# Patient Record
Sex: Female | Born: 1955 | Race: White | Hispanic: No | Marital: Married | Smoking: Never smoker
Health system: Southern US, Community
[De-identification: ages and names within clinical notes are randomized; demographics above are authoritative.]

## PROBLEM LIST (undated history)

## (undated) DIAGNOSIS — IMO0001 Reserved for inherently not codable concepts without codable children: Secondary | ICD-10-CM

## (undated) DIAGNOSIS — M8580 Other specified disorders of bone density and structure, unspecified site: Secondary | ICD-10-CM

## (undated) DIAGNOSIS — K219 Gastro-esophageal reflux disease without esophagitis: Secondary | ICD-10-CM

## (undated) DIAGNOSIS — C801 Malignant (primary) neoplasm, unspecified: Secondary | ICD-10-CM

## (undated) DIAGNOSIS — E785 Hyperlipidemia, unspecified: Secondary | ICD-10-CM

## (undated) DIAGNOSIS — Z923 Personal history of irradiation: Secondary | ICD-10-CM

## (undated) DIAGNOSIS — E039 Hypothyroidism, unspecified: Secondary | ICD-10-CM

## (undated) HISTORY — DX: Hypothyroidism, unspecified: E03.9

## (undated) HISTORY — DX: Other specified disorders of bone density and structure, unspecified site: M85.80

## (undated) HISTORY — PX: OTHER SURGICAL HISTORY: SHX169

## (undated) HISTORY — DX: Hyperlipidemia, unspecified: E78.5

## (undated) HISTORY — DX: Reserved for inherently not codable concepts without codable children: IMO0001

## (undated) HISTORY — DX: Malignant (primary) neoplasm, unspecified: C80.1

## (undated) HISTORY — DX: Gastro-esophageal reflux disease without esophagitis: K21.9

---

## 1980-04-28 HISTORY — PX: TUBAL LIGATION: SHX77

## 2009-03-28 LAB — HM COLONOSCOPY

## 2010-04-24 LAB — HM PAP SMEAR

## 2010-05-13 ENCOUNTER — Encounter
Admission: RE | Admit: 2010-05-13 | Discharge: 2010-05-13 | Payer: Self-pay | Source: Home / Self Care | Attending: Obstetrics and Gynecology | Admitting: Obstetrics and Gynecology

## 2010-05-13 LAB — HM MAMMOGRAPHY

## 2012-04-28 DIAGNOSIS — C801 Malignant (primary) neoplasm, unspecified: Secondary | ICD-10-CM

## 2012-04-28 DIAGNOSIS — Z923 Personal history of irradiation: Secondary | ICD-10-CM

## 2012-04-28 HISTORY — DX: Malignant (primary) neoplasm, unspecified: C80.1

## 2012-04-28 HISTORY — DX: Personal history of irradiation: Z92.3

## 2012-07-15 ENCOUNTER — Encounter: Payer: Self-pay | Admitting: Obstetrics and Gynecology

## 2012-07-15 ENCOUNTER — Ambulatory Visit (INDEPENDENT_AMBULATORY_CARE_PROVIDER_SITE_OTHER): Payer: BC Managed Care – PPO | Admitting: Obstetrics and Gynecology

## 2012-07-15 VITALS — BP 124/70 | Ht 62.5 in | Wt 167.0 lb

## 2012-07-15 DIAGNOSIS — Z1231 Encounter for screening mammogram for malignant neoplasm of breast: Secondary | ICD-10-CM

## 2012-07-15 DIAGNOSIS — K5900 Constipation, unspecified: Secondary | ICD-10-CM

## 2012-07-15 DIAGNOSIS — R350 Frequency of micturition: Secondary | ICD-10-CM

## 2012-07-15 DIAGNOSIS — Z0000 Encounter for general adult medical examination without abnormal findings: Secondary | ICD-10-CM

## 2012-07-15 NOTE — Patient Instructions (Signed)
Urinary Frequency The number of times a normal person urinates depends upon how much liquid they take in and how much liquid they are losing. If the temperature is hot and there is high humidity then the person will sweat more and usually breathe a little more frequently. These factors decrease the amount of frequency of urination that would be considered normal. The amount you drink is easily determined, but the amount of fluid lost is sometimes more difficult to calculate.  Fluid is lost in two ways:  Sensible fluid loss is usually measured by the amount of urine that you get rid of. Losses of fluid can also occur with diarrhea.  Insensible fluid loss is more difficult to measure. It is caused by evaporation. Insensible loss of fluid occurs through breathing and sweating. It usually ranges from a little less than a quart to a little more than a quart of fluid a day. In normal temperatures and activity levels the average person may urinate 4 to 7 times in a 24-hour period. Needing to urinate more often than that could indicate a problem. If one urinates 4 to 7 times in 24 hours and has large volumes each time, that could indicate a different problem from one who urinates 4 to 7 times a day and has small volumes. The time of urinating is also an important. Most urinating should be done during the waking hours. Getting up at night to urinate frequently can indicate some problems. CAUSES  The bladder is the organ in your lower abdomen that holds urine. Like a balloon, it swells some as it fills up. Your nerves sense this and tell you it is time to head for the bathroom. There are a number of reasons that you might feel the need to urinate more often than usual. They include:  Urinary tract infection. This is usually associated with other signs such as burning when you urinate.  In men, problems with the prostate (a walnut-size gland that is located near the tube that carries urine out of your body).  There are two reasons why the prostate can cause an increased frequency of urination:  An enlarged prostate that does not let the bladder empty well. If the bladder only half empties when you urinate then it only has half the capacity to fill before you have to urinate again.  The nerves in the bladder become more hypersensitive with an increased size of the prostate even if the bladder empties completely.  Pregnancy.  Obesity. Excess weight is more likely to cause a problem for women more than for men.  Bladder stones or other bladder problems.  Caffeine.  Alcohol.  Medications. For example, drugs that help the body get rid of extra fluid (diuretics) increase urine production. Some other medicines must be taken with lots of fluids.  Muscle or nerve weakness. This might be the result of a spinal cord injury, a stroke, multiple sclerosis or Parkinson's disease.  Long-standing diabetes can decrease the sensation of the bladder. This loss of sensation makes it harder to sense the bladder needs to be emptied. Over a period of years the bladder is stretched out by constant overfilling. This weakens the bladder muscles so that the bladder does not empty well and has less capacity to fill with new urine.  Interstitial cystitis (also called painful bladder syndrome). This condition develops because the tissues that line the insider of the bladder are inflamed (inflammation is the body's way of reacting to injury or infection). It causes pain  and frequent urination. It occurs in women more often than in men. DIAGNOSIS   To decide what might be causing your urinary frequency, your healthcare provider will probably:  Ask about symptoms you have noticed.  Ask about your overall health. This will include questions about any medications you are taking.  Do a physical examination.  Order some tests. These might include:  A blood test to check for diabetes or other health issues that could be  contributing to the problem.  Urine testing. This could measure the flow of urine and the pressure on the bladder.  A test of your neurological system (the brain, spinal cord and nerves). This is the system that senses the need to urinate.  A bladder test to check whether it is emptying completely when you urinate.  Cytoscopy. This test uses a thin tube with a tiny camera on it. It offers a look inside your urethra and bladder to see if there are problems.  Imaging tests. You might be given a contrast dye and then asked to urinate. X-rays are taken to see how your bladder is working. TREATMENT  It is important for you to be evaluated to determine if the amount or frequency that you have is unusual or abnormal. If it is found to be abnormal the cause should be determined and this can usually be found out easily. Depending upon the cause treatment could include medication, stimulation of the nerves, or surgery. There are not too many things that you can do as an individual to change your urinary frequency. It is important that you balance the amount of fluid intake needed to compensate for your activity and the temperature. Medical problems will be diagnosed and taken care of by your physician. There is no particular bladder training such as Kegel's exercises that you can do to help urinary frequency. This is an exercise this is usually done for people who have leaking of urine when they laugh cough or sneeze. HOME CARE INSTRUCTIONS   Take any medications your healthcare provider prescribed or suggested. Follow the directions carefully.  Practice any lifestyle changes that are recommended. These might include:  Drinking less fluid or drinking at different times of the day. If you need to urinate often during the night, for example, you may need to stop drinking fluids early in the evening.  Cutting down on caffeine or alcohol. They both can make you need to urinate more often than normal.  Caffeine is found in coffee, tea and sodas.  Losing weight, if that is recommended.  Keep a journal or a log. You might be asked to record how much you drink and when and when you feel the need to urinate. This will also help evaluate how well the treatment provided by your physician is working. SEEK MEDICAL CARE IF:   Your need to urinate often gets worse.  You feel increased pain or irritation when you urinate.  You notice blood in your urine.  You have questions about any medications that your healthcare provider recommended.  You notice blood, pus or swelling at the site of any test or treatment procedure.  You develop a fever of more than 100.5 F (38.1 C). SEEK IMMEDIATE MEDICAL CARE IF:  You develop a fever of more than 102.0 F (38.9 C). Document Released: 02/08/2009 Document Revised: 07/07/2011 Document Reviewed: 02/08/2009 [facility] Patient Information 2013 [place], [place].  Constipation, Adult Constipation is when a person has fewer than 3 bowel movements a week; has difficulty having a  bowel movement; or has stools that are dry, hard, or larger than normal. As people grow older, constipation is more common. If you try to fix constipation with medicines that make you have a bowel movement (laxatives), the problem may get worse. Long-term laxative use may cause the muscles of the colon to become weak. A low-fiber diet, not taking in enough fluids, and taking certain medicines may make constipation worse. CAUSES   Certain medicines, such as antidepressants, pain medicine, iron supplements, antacids, and water pills.   Certain diseases, such as diabetes, irritable bowel syndrome (IBS), thyroid disease, or depression.   Not drinking enough water.   Not eating enough fiber-rich foods.   Stress or travel.  Lack of physical activity or exercise.  Not going to the restroom when there is the urge to have a bowel movement.  Ignoring the urge to have a bowel  movement.  Using laxatives too much. SYMPTOMS   Having fewer than 3 bowel movements a week.   Straining to have a bowel movement.   Having hard, dry, or larger than normal stools.   Feeling full or bloated.   Pain in the lower abdomen.  Not feeling relief after having a bowel movement. DIAGNOSIS  Your caregiver will take a medical history and perform a physical exam. Further testing may be done for severe constipation. Some tests may include:   A barium enema X-ray to examine your rectum, colon, and sometimes, your small intestine.  A sigmoidoscopy to examine your lower colon.  A colonoscopy to examine your entire colon. TREATMENT  Treatment will depend on the severity of your constipation and what is causing it. Some dietary treatments include drinking more fluids and eating more fiber-rich foods. Lifestyle treatments may include regular exercise. If these diet and lifestyle recommendations do not help, your caregiver may recommend taking over-the-counter laxative medicines to help you have bowel movements. Prescription medicines may be prescribed if over-the-counter medicines do not work.  HOME CARE INSTRUCTIONS   Increase dietary fiber in your diet, such as fruits, vegetables, whole grains, and beans. Limit high-fat and processed sugars in your diet, such as [place] fries, hamburgers, cookies, candies, and soda.   A fiber supplement may be added to your diet if you cannot get enough fiber from foods.   Drink enough fluids to keep your urine clear or pale yellow.   Exercise regularly or as directed by your caregiver.   Go to the restroom when you have the urge to go. Do not hold it.  Only take medicines as directed by your caregiver. Do not take other medicines for constipation without talking to your caregiver first. SEEK IMMEDIATE MEDICAL CARE IF:   You have bright red blood in your stool.   Your constipation lasts for more than 4 days or gets worse.   You  have abdominal or rectal pain.   You have thin, pencil-like stools.  You have unexplained weight loss. MAKE SURE YOU:   Understand these instructions.  Will watch your condition.  Will get help right away if you are not doing well or get worse. Document Released: 01/11/2004 Document Revised: 07/07/2011 Document Reviewed: 03/18/2011 [facility] Patient Information 2013 [place], [place].

## 2012-07-15 NOTE — Progress Notes (Signed)
Patient ID: Kelsey Meadows, female   DOB: February 11, 1956, 57 y.o.   MRN: [id number]  [id number] with a LMP 2010 or 2011 who presents with bladder prolapse noted by family medicine physician.  Patient reports strong odor to urine, burning of skin with urination.  PCP treated patient for minor UTI due to these symptoms and back pain.   Feels protrusion.  Has urgency with voiding.  Bought Poise pads for protection.  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  Doing fluid restriction.    States backache, unbearable.  Using a back brace.  Patient diagnosed with scoliosis and "pinched nerve."  Sees Dr. Renae Fickle, [facility] for bursitis also.  Can leak urine with cough, laugh, sneeze.  Does not leak for no reason at all.  DF up to every 30 minutes.  NF 4 times.  No enuresis.  Denies hematuria.  No prior UTI other than recently.  No pyelonephritis.  No kidney stones.  Voids with suprapubic pressure to force urine out.    No leakage of stool or gas.  Reports constipation.  Uses stool softener with a mild stimulant daily.  Occasionally uses Miralax.  Uses apples and bananas in diet a lot.  Does fiber breads.  No regular exercise routine.    Has not had prior evaluation.  GYN Not sexually active.  Has painful intercourse.   No hormone therapy. S/P BTL, D & C.  OB 2 vaginal deliveries.  Largest child 8 pounds, 12 ounces.  Had forceps.  No third or fourth degree laceration.  See PMH/PSH.  See Meds/Allergies.  SOC Married. 2 cups coffee per day. 2 sodas per week. No tea. No citrus drinks.  Avoids artificial sweeteners due to reflux. LIke cranberry/grape drinks.  PE Abdomen:  Soft, nontender, nondistended.  No hepatospelnomegaly or organomegaly.   Pelvic:  Normal external genitalia/urethra.  Vagina and cervix normal.  Small, anteverted, nontender uterus.  No adnexal masses or tenderness.  Excellent vaginal and uterine support. Rectovaginal exam:  Confirms above.     Impression:  Recent UTI. Urinary frequency. Constipation.  Plan:  Urinalysis and culture. Avoid bladder irritants.   Will work on timed voiding after removes irritants. Consider local vaginal estrogen therapy. Discussed proper diet to treat constipation. Start exercise plan  Return for annual exam.  After visit summary printed for patient.

## 2012-07-16 LAB — URINALYSIS W MICROSCOPIC + REFLEX CULTURE

## 2012-07-19 ENCOUNTER — Other Ambulatory Visit: Payer: Self-pay | Admitting: Obstetrics and Gynecology

## 2012-07-19 DIAGNOSIS — N390 Urinary tract infection, site not specified: Secondary | ICD-10-CM

## 2012-08-09 ENCOUNTER — Telehealth: Payer: Self-pay

## 2012-08-09 NOTE — Telephone Encounter (Signed)
Left multiple messages for patient to call to come back to office to collect urine sample for urine culture. Patient never returned any of my calls.  Per Dr. Edward Jolly, no further action.

## 2012-08-10 ENCOUNTER — Ambulatory Visit: Payer: Self-pay

## 2012-08-11 ENCOUNTER — Ambulatory Visit: Payer: Self-pay | Admitting: Obstetrics and Gynecology

## 2012-08-13 ENCOUNTER — Ambulatory Visit
Admission: RE | Admit: 2012-08-13 | Discharge: 2012-08-13 | Disposition: A | Discharge status: Home / Self Care | Payer: BC Managed Care – PPO | Source: Ambulatory Visit | Attending: Obstetrics and Gynecology | Admitting: Obstetrics and Gynecology

## 2012-08-13 DIAGNOSIS — Z1231 Encounter for screening mammogram for malignant neoplasm of breast: Secondary | ICD-10-CM

## 2012-08-16 ENCOUNTER — Other Ambulatory Visit: Payer: Self-pay | Admitting: Obstetrics and Gynecology

## 2012-08-16 ENCOUNTER — Ambulatory Visit: Payer: Self-pay

## 2012-08-16 DIAGNOSIS — R928 Other abnormal and inconclusive findings on diagnostic imaging of breast: Secondary | ICD-10-CM

## 2012-08-24 ENCOUNTER — Telehealth: Payer: Self-pay | Admitting: *Deleted

## 2012-08-24 NOTE — Telephone Encounter (Signed)
Baird Lyons from [facility] called to requestTo be done  you sign off on order for patient mammogram Diagnostic LPD and right ultra sound.Marland Kitchenappt. 08/26/2012. sue

## 2012-08-25 ENCOUNTER — Encounter: Payer: Self-pay | Admitting: Obstetrics and Gynecology

## 2012-08-25 ENCOUNTER — Ambulatory Visit (INDEPENDENT_AMBULATORY_CARE_PROVIDER_SITE_OTHER): Payer: BC Managed Care – PPO | Admitting: Obstetrics and Gynecology

## 2012-08-25 VITALS — BP 122/66 | Ht 62.5 in | Wt 170.0 lb

## 2012-08-25 DIAGNOSIS — Z0000 Encounter for general adult medical examination without abnormal findings: Secondary | ICD-10-CM

## 2012-08-25 DIAGNOSIS — Z01419 Encounter for gynecological examination (general) (routine) without abnormal findings: Secondary | ICD-10-CM

## 2012-08-25 DIAGNOSIS — N390 Urinary tract infection, site not specified: Secondary | ICD-10-CM

## 2012-08-25 NOTE — Telephone Encounter (Signed)
Hard Copies of orders faxed to [facility]. sue

## 2012-08-25 NOTE — Patient Instructions (Signed)

## 2012-08-25 NOTE — Telephone Encounter (Signed)
Hard copies for these orders were just given to you.  Thank you.

## 2012-08-25 NOTE — Addendum Note (Signed)
Addended by: Clide Dales R on: 08/25/2012 04:11 PM   Modules accepted: Orders

## 2012-08-25 NOTE — Progress Notes (Signed)
Patient ID: Marcine Matar, female   DOB: May 28, 1955, 57 y.o.   MRN: [id number] 57 y.o.  Married  Caucasian female   [phone] here for annual exam.   Prefers not to take hormone therapy.  Interested in nonhormonal treatment for vaginal dryness.  Believes that the dryness causes urinary discomfort.  Bladder feels so much better than when she came in in March this year for a visit. Occasional leakage of urine with stressful maneuvers.  Patient's last menstrual period was 04/29/2007.          Sexually active: no  The current method of family planning is post menopausal status.    Exercising: no Last mammogram:  08-13-12 [facility]:returning 08-26-12 for extra views of rt. Breast   Last pap smear: 04-24-10 wnl: no HPV done History of abnormal pap: no Smoking: no Alcohol: no Last colonoscopy: 2012 wnl Dr. Charm Barges in [place]: next due 2017 Last Bone Density:  never Last tetanus shot: 2012 Last cholesterol check:  2013  Hgb: PCP                Urine: trace WBCs.    Health Maintenance  Topic Date Due  . Tetanus/tdap  01/03/1973  . Influenza Vaccine  12/27/2012  . Pap Smear  04/24/2013  . Mammogram  08/14/2014  . Colonoscopy  03/29/2019    Family History  Problem Relation Age of Onset  . Depression Mother   . Osteoporosis Mother   . Stroke Father     There are no active problems to display for this patient.   Past Medical History  Diagnosis Date  . Hypothyroid   . Reflux   . Hyperlipidemia     Past Surgical History  Procedure Laterality Date  . Hyperlipid    . Tubal ligation  1982    Allergies: Review of patient's allergies indicates no known allergies.  Current Outpatient Prescriptions  Medication Sig Dispense Refill  . aspirin 81 MG tablet Take 81 mg by mouth daily.      . B Complex Vitamins (B COMPLEX-B12 PO) Take 1 capsule by mouth daily.      . cetirizine (ZYRTEC) 10 MG tablet Take 10 mg by mouth daily.      Marland Kitchen HYDROcodone-acetaminophen (NORCO/VICODIN) 5-325  MG per tablet Take 1 tablet by mouth every 6 (six) hours as needed for pain. Take 1/2 tablet at night      . KRILL OIL PO Take 1 capsule by mouth daily.      Marland Kitchen levothyroxine (SYNTHROID, LEVOTHROID) 25 MCG tablet Take 25 mcg by mouth daily. Take 1/2 tablet daily      . Nutritional Supplements (ESTROVEN ENERGY) TABS Take 1 capsule by mouth daily.      Marland Kitchen omeprazole (PRILOSEC) 20 MG capsule Take 20 mg by mouth daily.      . rosuvastatin (CRESTOR) 10 MG tablet Take 10 mg by mouth daily.      . sertraline (ZOLOFT) 50 MG tablet Take 50 mg by mouth daily.       No current facility-administered medications for this visit.    ROS: Pertinent items are noted in HPI.  Social Hx:  Married.  Works in [employer].  Exam:    BP 122/66  Ht 5' 2.5" (1.588 m)  Wt 170 lb (77.111 kg)  BMI 30.58 kg/m2  LMP 04/29/2007   Wt Readings from Last 3 Encounters:  08/25/12 170 lb (77.111 kg)  07/15/12 167 lb (75.751 kg)     Ht Readings from Last  3 Encounters:  08/25/12 5' 2.5" (1.588 m)  07/15/12 5' 2.5" (1.588 m)    General appearance: alert, cooperative and appears stated age Head: Normocephalic, without obvious abnormality, atraumatic Neck: no adenopathy, supple, symmetrical, trachea midline and thyroid not enlarged, symmetric, no tenderness/mass/nodules Lungs: clear to auscultation bilaterally Breasts: Inspection negative, No nipple retraction or dimpling, No nipple discharge or bleeding, No axillary or supraclavicular adenopathy, Normal to palpation without dominant masses Heart: regular rate and rhythm Abdomen: soft, non-tender; bowel sounds normal; no masses,  no organomegaly Extremities: extremities normal, atraumatic, no cyanosis or edema Skin: Skin color, texture, turgor normal. No rashes or lesions Lymph nodes: Cervical, supraclavicular, and axillary nodes normal. No abnormal inguinal nodes palpated Neurologic: Grossly normal   Pelvic: External genitalia:  no lesions              Urethra:   normal appearing urethra with no masses, tenderness or lesions              Bartholins and Skenes: normal                 Vagina: normal appearing vagina with normal color and discharge, no lesions              Cervix: normal appearance              Pap taken: yes and HR HPV testing.        Bimanual Exam:  Uterus:  uterus is normal size, shape, consistency and nontender                                      Adnexa: normal adnexa in size, nontender and no masses                                      Rectovaginal: Confirms                                      Anus:  normal sphincter tone, no lesions  A: normal gyn exam Recent abnormal mammogram Atrophic vaginitis. Trace leukocytes on urine dip.  Recent UTI.     P: mammogram diagnostic on the right and right breast ultrasound tomorrow. pap smear and HR HPV testing. Patient will purchase vitamin E vaginal suppositories through [employer] com to use for vaginal dryness.  Use nightly for two weeks and then use twice a week as maintenance. Urine culture. return annually or prn     An After Visit Summary was printed and given to the patient.

## 2012-08-26 ENCOUNTER — Other Ambulatory Visit: Payer: Self-pay | Admitting: Obstetrics and Gynecology

## 2012-08-26 ENCOUNTER — Ambulatory Visit
Admission: RE | Admit: 2012-08-26 | Discharge: 2012-08-26 | Disposition: A | Discharge status: Home / Self Care | Payer: BC Managed Care – PPO | Source: Ambulatory Visit | Attending: Obstetrics and Gynecology | Admitting: Obstetrics and Gynecology

## 2012-08-26 DIAGNOSIS — R928 Other abnormal and inconclusive findings on diagnostic imaging of breast: Secondary | ICD-10-CM

## 2012-08-26 DIAGNOSIS — N6310 Unspecified lump in the right breast, unspecified quadrant: Secondary | ICD-10-CM

## 2012-08-27 ENCOUNTER — Telehealth: Payer: Self-pay

## 2012-08-27 LAB — URINE CULTURE: Colony Count: 25000

## 2012-08-27 NOTE — Telephone Encounter (Signed)
Message copied by Alphonsa Overall on Fri Aug 27, 2012  4:40 PM ------      Message from: Conley Simmonds      Created: Fri Aug 27, 2012 10:15 AM       Please inform of normal pap, negative HPV, and negative urine culture.            Thanks,            [employer] ------

## 2012-08-27 NOTE — Telephone Encounter (Signed)
lmovm HM# to call to discuss test results

## 2012-08-30 NOTE — Telephone Encounter (Signed)
Returning phone call °

## 2012-08-30 NOTE — Telephone Encounter (Signed)
Patient notified pap and urine culture normal.

## 2012-08-31 ENCOUNTER — Ambulatory Visit
Admission: RE | Admit: 2012-08-31 | Discharge: 2012-08-31 | Disposition: A | Discharge status: Home / Self Care | Payer: BC Managed Care – PPO | Source: Ambulatory Visit | Attending: Obstetrics and Gynecology | Admitting: Obstetrics and Gynecology

## 2012-08-31 ENCOUNTER — Other Ambulatory Visit: Payer: Self-pay | Admitting: Obstetrics and Gynecology

## 2012-08-31 DIAGNOSIS — N6310 Unspecified lump in the right breast, unspecified quadrant: Secondary | ICD-10-CM

## 2012-09-02 ENCOUNTER — Telehealth: Payer: Self-pay | Admitting: Obstetrics and Gynecology

## 2012-09-02 ENCOUNTER — Other Ambulatory Visit: Payer: Self-pay | Admitting: Obstetrics and Gynecology

## 2012-09-02 DIAGNOSIS — C50911 Malignant neoplasm of unspecified site of right female breast: Secondary | ICD-10-CM

## 2012-09-02 NOTE — Telephone Encounter (Signed)
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    Patient will be having a breast MRI in [place] and then receiving surgical and oncology care in [place].  I offered assistance if the patient needs it.

## 2012-09-03 ENCOUNTER — Telehealth: Payer: Self-pay | Admitting: Obstetrics and Gynecology

## 2012-09-03 NOTE — Telephone Encounter (Signed)
Patient wants to change birth control, something low-cost. [facility] [address]

## 2012-09-03 NOTE — Telephone Encounter (Signed)
Left message for patient to call back for more info. Marland Kitchen

## 2012-09-09 ENCOUNTER — Ambulatory Visit
Admission: RE | Admit: 2012-09-09 | Discharge: 2012-09-09 | Disposition: A | Discharge status: Home / Self Care | Payer: BC Managed Care – PPO | Source: Ambulatory Visit | Attending: Obstetrics and Gynecology | Admitting: Obstetrics and Gynecology

## 2012-09-09 ENCOUNTER — Telehealth: Payer: Self-pay | Admitting: Obstetrics and Gynecology

## 2012-09-09 DIAGNOSIS — C50911 Malignant neoplasm of unspecified site of right female breast: Secondary | ICD-10-CM

## 2012-09-09 MED ORDER — GADOBENATE DIMEGLUMINE 529 MG/ML IV SOLN
16.0000 mL | Freq: Once | INTRAVENOUS | Status: AC | PRN
  Administered 2012-09-09: 16 mL via INTRAVENOUS

## 2012-09-09 NOTE — Telephone Encounter (Signed)
Please inform patient that the MRI showed the known cancer of the right breast.  The imaging also showed a 4 cm hematoma related to the recent biopsy.  Please print and send a report to the patient's surgeon.  Thank you.  [employer]

## 2012-09-09 NOTE — Telephone Encounter (Signed)
Patient called to request that the results of her MRI are faxed to her surgeon Dr. Terrilee Files ([place], [place]).   Patient had an MRI done today (diagnosed with breast cancer). The results of her MRI will be sent to Dr.Silva from "[facility]," so she would like for [place] to fax that information to her surgeon. Her appointment with Dr.Lininger is 9am on Monday, Sep 13, 2012.

## 2012-09-10 ENCOUNTER — Telehealth: Payer: Self-pay | Admitting: *Deleted

## 2012-09-10 NOTE — Telephone Encounter (Signed)
Patient left message with front desk requesting MRI results be sent to Dr Georgiana Shore in [place] for her surgeon consult on Mon 10-14-12 new diagnosis breast cancer.  Results faxed to [phone] [place]. LM on pts VM that this has been done.

## 2012-09-17 NOTE — Telephone Encounter (Signed)
MRI, Breast BX faxed to Dr. Terrilee Files

## 2012-09-26 HISTORY — PX: BREAST SURGERY: SHX581

## 2012-09-29 HISTORY — PX: BREAST LUMPECTOMY: SHX2

## 2013-04-12 ENCOUNTER — Other Ambulatory Visit: Payer: Self-pay | Admitting: Oncology

## 2013-04-12 DIAGNOSIS — C50111 Malignant neoplasm of central portion of right female breast: Secondary | ICD-10-CM

## 2013-04-22 ENCOUNTER — Ambulatory Visit
Admission: RE | Admit: 2013-04-22 | Discharge: 2013-04-22 | Disposition: A | Discharge status: Home / Self Care | Payer: BC Managed Care – PPO | Source: Ambulatory Visit | Attending: Oncology | Admitting: Oncology

## 2013-04-22 DIAGNOSIS — C50111 Malignant neoplasm of central portion of right female breast: Secondary | ICD-10-CM

## 2013-05-29 HISTORY — PX: WRIST SURGERY: SHX841

## 2013-06-27 ENCOUNTER — Other Ambulatory Visit: Payer: Self-pay | Admitting: Oncology

## 2013-06-27 DIAGNOSIS — Z853 Personal history of malignant neoplasm of breast: Secondary | ICD-10-CM

## 2013-08-23 ENCOUNTER — Other Ambulatory Visit: Payer: Self-pay | Admitting: Obstetrics and Gynecology

## 2013-08-23 DIAGNOSIS — Z853 Personal history of malignant neoplasm of breast: Secondary | ICD-10-CM

## 2013-10-10 ENCOUNTER — Ambulatory Visit
Admission: RE | Admit: 2013-10-10 | Discharge: 2013-10-10 | Disposition: A | Discharge status: Home / Self Care | Payer: BC Managed Care – PPO | Source: Ambulatory Visit | Attending: Obstetrics and Gynecology | Admitting: Obstetrics and Gynecology

## 2013-10-10 ENCOUNTER — Other Ambulatory Visit: Payer: Self-pay | Admitting: Obstetrics and Gynecology

## 2013-10-10 DIAGNOSIS — Z853 Personal history of malignant neoplasm of breast: Secondary | ICD-10-CM

## 2013-10-14 ENCOUNTER — Encounter: Payer: Self-pay | Admitting: Obstetrics and Gynecology

## 2013-10-14 ENCOUNTER — Ambulatory Visit (INDEPENDENT_AMBULATORY_CARE_PROVIDER_SITE_OTHER): Payer: BC Managed Care – PPO | Admitting: Obstetrics and Gynecology

## 2013-10-14 VITALS — BP 124/76 | HR 60 | Resp 18 | Ht 62.5 in | Wt 173.0 lb

## 2013-10-14 DIAGNOSIS — C50919 Malignant neoplasm of unspecified site of unspecified female breast: Secondary | ICD-10-CM

## 2013-10-14 DIAGNOSIS — Z0000 Encounter for general adult medical examination without abnormal findings: Secondary | ICD-10-CM

## 2013-10-14 DIAGNOSIS — C50911 Malignant neoplasm of unspecified site of right female breast: Secondary | ICD-10-CM | POA: Insufficient documentation

## 2013-10-14 DIAGNOSIS — Z01419 Encounter for gynecological examination (general) (routine) without abnormal findings: Secondary | ICD-10-CM

## 2013-10-14 LAB — POCT URINALYSIS DIPSTICK
Bilirubin, UA: NEGATIVE
GLUCOSE UA: NEGATIVE
Ketones, UA: NEGATIVE
LEUKOCYTES UA: NEGATIVE
Nitrite, UA: NEGATIVE
PH UA: 8
Protein, UA: NEGATIVE
RBC UA: NEGATIVE
UROBILINOGEN UA: NEGATIVE

## 2013-10-14 NOTE — Patient Instructions (Signed)

## 2013-10-14 NOTE — Progress Notes (Signed)
GYNECOLOGY VISIT  PCP: Kennith Maes  Referring provider:   HPI: 58 y.o.   Married  Caucasian  female   [phone] with Patient's last menstrual period was 04/29/2007.   here for   Annual Gynecological Exam Had diagnosis of breast cancer diagnosed on mammogram. Status post right lumpectomy 09/29/13 with lymph node dissection at [facility] in [place] with XRT there as well.  Taking Arimidex.  Broke left wrist roller skating.  Had plate and screws placed.  Hgb:  PCP Urine:  Clear  GYNECOLOGIC HISTORY: Patient's last menstrual period was 04/29/2007. Sexually active:  No Partner preference: Female Contraception:   Tubal Ligation  Menopausal hormone therapy: No DES exposure:  No  Blood transfusions:  No  Sexually transmitted diseases:   No GYN procedures and prior surgeries:  Tubal Ligation  Last mammogram: 10/10/13 BIRADS2                Last pap and high risk HPV testing: 07/2012 HR HPV Neg   History of abnormal pap smear:  No   OB History   Grav Para Term Preterm Abortions TAB SAB Ect Mult Living   3 3 3       3        LIFESTYLE: Exercise:  No             Tobacco: No Alcohol: No Drug use:  No  OTHER HEALTH MAINTENANCE: Tetanus/TDap: 2012  Gardisil: No Influenza:  No Zostavax: No  Bone density: 10/2012 Colonoscopy: 2010  Cholesterol check: 12/2012 PCP  Family History  Problem Relation Age of Onset  . Depression Mother   . Osteoporosis Mother   . Stroke Father     There are no active problems to display for this patient.  Past Medical History  Diagnosis Date  . Hypothyroid   . Reflux   . Hyperlipidemia   . Cancer 2014    Breast Cancer    Past Surgical History  Procedure Laterality Date  . Hyperlipid    . Tubal ligation  1982  . Wrist surgery  05/2013  . Breast surgery  09/2012    Due to Breast Cancer    ALLERGIES: Review of patient's allergies indicates no known allergies.  Current Outpatient Prescriptions  Medication Sig Dispense Refill  .  anastrozole (ARIMIDEX) 1 MG tablet Take 1 mg by mouth daily.       Marland Kitchen Bioflavonoid Products (BIOFLEX) TABS Take by mouth daily.      Marland Kitchen levothyroxine (SYNTHROID, LEVOTHROID) 25 MCG tablet Take 25 mcg by mouth daily. Take 1/2 tablet daily      . Multiple Vitamin (MULTIVITAMIN) tablet Take 1 tablet by mouth daily.      . naproxen sodium (ALEVE) 220 MG tablet Take 440 mg by mouth 2 (two) times daily with a meal.      . PRILOSEC OTC 20 MG tablet Take 20 mg by mouth daily.       . rosuvastatin (CRESTOR) 10 MG tablet Take 10 mg by mouth daily.      . sertraline (ZOLOFT) 50 MG tablet Take 50 mg by mouth daily.      Marland Kitchen aspirin 81 MG tablet Take 81 mg by mouth daily.      . B Complex Vitamins (B COMPLEX-B12 PO) Take 1 capsule by mouth daily.      . cetirizine (ZYRTEC) 10 MG tablet Take 10 mg by mouth daily.      Marland Kitchen HYDROcodone-acetaminophen (NORCO/VICODIN) 5-325 MG per tablet Take 1 tablet by mouth every  6 (six) hours as needed for pain. Take 1/2 tablet at night      . KRILL OIL PO Take 1 capsule by mouth daily.      . Nutritional Supplements (ESTROVEN ENERGY) TABS Take 1 capsule by mouth daily.       No current facility-administered medications for this visit.     ROS:  Pertinent items are noted in HPI.  SOCIAL HISTORY:  Works in [employer].   PHYSICAL EXAMINATION:    BP 124/76  Pulse 60  Resp 18  Ht 5' 2.5" (1.588 m)  Wt 173 lb (78.472 kg)  BMI 31.12 kg/m2  LMP 04/29/2007   Wt Readings from Last 3 Encounters:  10/14/13 173 lb (78.472 kg)  08/25/12 170 lb (77.111 kg)  07/15/12 167 lb (75.751 kg)     Ht Readings from Last 3 Encounters:  10/14/13 5' 2.5" (1.588 m)  08/25/12 5' 2.5" (1.588 m)  07/15/12 5' 2.5" (1.588 m)    General appearance: alert, cooperative and appears stated age Head: Normocephalic, without obvious abnormality, atraumatic Neck: no adenopathy, supple, symmetrical, trachea midline and thyroid not enlarged, symmetric, no tenderness/mass/nodules Lungs: clear to  auscultation bilaterally Breasts: Inspection negative, No nipple retraction or dimpling, No nipple discharge or bleeding, No axillary or supraclavicular adenopathy, Normal to palpation without dominant masses Heart: regular rate and rhythm Abdomen: soft, non-tender; no masses,  no organomegaly Extremities: extremities normal, atraumatic, no cyanosis or edema Skin: Skin color, texture, turgor normal. No rashes or lesions.  Multiple pigmented areas - freckling.  Lymph nodes: Cervical, supraclavicular, and axillary nodes normal. No abnormal inguinal nodes palpated Neurologic: Grossly normal  Pelvic: External genitalia:  no lesions              Urethra:  normal appearing urethra with no masses, tenderness or lesions              Bartholins and Skenes: normal                 Vagina: normal appearing vagina with normal color and discharge, no lesions              Cervix: normal appearance              Pap and high risk HPV testing done: no.            Bimanual Exam:  Uterus:  uterus is normal size, shape, consistency and nontender                                      Adnexa: normal adnexa in size, nontender and no masses                                      Rectovaginal: Confirms                                      Anus:  normal sphincter tone, no lesions  ASSESSMENT  Normal gynecologic exam. Right breast cancer - status post lumpectomy with XRT.  On Arimidex.  PLAN  Diagnostic mammogram recommended yearly.  Pap smear and high risk HPV testing not indicated.  Counseled on self breast exam, Calcium and vitamin D intake, exercise. I encouraged a dermatology visit for a general  skin check. Return annually or prn   An After Visit Summary was printed and given to the patient.

## 2013-10-25 ENCOUNTER — Other Ambulatory Visit: Payer: Self-pay | Admitting: Vascular Surgery

## 2013-10-25 DIAGNOSIS — Z1231 Encounter for screening mammogram for malignant neoplasm of breast: Secondary | ICD-10-CM

## 2014-02-27 ENCOUNTER — Encounter: Payer: Self-pay | Admitting: Obstetrics and Gynecology

## 2014-10-10 ENCOUNTER — Other Ambulatory Visit: Payer: Self-pay | Admitting: Obstetrics and Gynecology

## 2014-10-10 DIAGNOSIS — Z853 Personal history of malignant neoplasm of breast: Secondary | ICD-10-CM

## 2014-10-10 DIAGNOSIS — Z9889 Other specified postprocedural states: Secondary | ICD-10-CM

## 2014-10-12 ENCOUNTER — Ambulatory Visit: Payer: BC Managed Care – PPO

## 2014-10-23 ENCOUNTER — Ambulatory Visit
Admission: RE | Admit: 2014-10-23 | Discharge: 2014-10-23 | Disposition: A | Discharge status: Home / Self Care | Payer: 59 | Source: Ambulatory Visit | Attending: Obstetrics and Gynecology | Admitting: Obstetrics and Gynecology

## 2014-10-23 DIAGNOSIS — Z9889 Other specified postprocedural states: Secondary | ICD-10-CM

## 2014-10-23 DIAGNOSIS — Z853 Personal history of malignant neoplasm of breast: Secondary | ICD-10-CM

## 2014-10-25 ENCOUNTER — Ambulatory Visit (INDEPENDENT_AMBULATORY_CARE_PROVIDER_SITE_OTHER): Payer: 59 | Admitting: Obstetrics and Gynecology

## 2014-10-25 ENCOUNTER — Encounter: Payer: Self-pay | Admitting: Obstetrics and Gynecology

## 2014-10-25 VITALS — BP 124/80 | HR 60 | Resp 16 | Ht 62.5 in | Wt 168.6 lb

## 2014-10-25 DIAGNOSIS — Z0000 Encounter for general adult medical examination without abnormal findings: Secondary | ICD-10-CM | POA: Diagnosis not present

## 2014-10-25 DIAGNOSIS — Z01419 Encounter for gynecological examination (general) (routine) without abnormal findings: Secondary | ICD-10-CM

## 2014-10-25 LAB — POCT URINALYSIS DIPSTICK
Bilirubin, UA: NEGATIVE
Glucose, UA: NEGATIVE
KETONES UA: NEGATIVE
Leukocytes, UA: NEGATIVE
Nitrite, UA: NEGATIVE
Protein, UA: NEGATIVE
RBC UA: NEGATIVE
Urobilinogen, UA: NEGATIVE
pH, UA: 5

## 2014-10-25 NOTE — Progress Notes (Signed)
Patient ID: Kelsey Meadows, female   DOB: 1955-05-12, 59 y.o.   MRN: [id number] 59 y.o. G62P3003 Married Caucasian female here for annual exam.    Hx of right breast cancer.  Husband with neuropathy and depression/anxiety.  Patient not working now. 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   Saw the benefit of going to beach without her husband.  Felt she got some respite from caring for him.   PCP:  Kennith Maes, MD   Patient's last menstrual period was 04/29/2007.          Sexually active: No. female  The current method of family planning is tubal ligation.--husband with health issues also.    Exercising: No.  none. Smoker:  no  Health Maintenance: Pap:  07/2012 wnl:neg HR HPV History of abnormal Pap:  no MMG:  10-23-14 Hx Rt.br.CA, Diag.Bil.Cat.B/Stable surgical radiation changes in Rt.br./neg:BiRads 2:[facility]. Colonoscopy:  2010 normal with Dr. Nehemiah Settle. Pt. With hx colon polyps and due for repeat colonoscopy--she will schedule(has delayed due to husbands decline in health). BMD:   12/2013  Result  Osteopenia with Oncologist in [place]. TDaP:  ?2012 Screening Labs:  Hb today: PCP, Urine today: Neg   reports that she has never smoked. She has never used smokeless tobacco. She reports that she does not drink alcohol or use illicit drugs.  Past Medical History  Diagnosis Date  . Hypothyroid   . Reflux   . Hyperlipidemia   . Cancer 2014    Breast Cancer    Past Surgical History  Procedure Laterality Date  . Hyperlipid    . Tubal ligation  1982  . Wrist surgery  05/2013  . Breast surgery  09/2012    Due to Breast Cancer    Current Outpatient Prescriptions  Medication Sig Dispense Refill  . anastrozole (ARIMIDEX) 1 MG tablet Take 1 mg by mouth daily.     Marland Kitchen aspirin 81 MG tablet Take 81 mg by mouth daily.    . B Complex Vitamins (B COMPLEX-B12 PO) Take 1 capsule by mouth daily.    Marland Kitchen Bioflavonoid Products (BIOFLEX) TABS Take by mouth daily.    . cetirizine  (ZYRTEC) 10 MG tablet Take 10 mg by mouth daily.    . cholecalciferol (VITAMIN D) 1000 UNITS tablet Take 1,000 Units by mouth daily.    . meloxicam (MOBIC) 15 MG tablet Take 15 mg by mouth daily.    . Multiple Vitamin (MULTIVITAMIN) tablet Take 1 tablet by mouth daily.    Marland Kitchen PRILOSEC OTC 20 MG tablet Take 20 mg by mouth daily.     . rosuvastatin (CRESTOR) 10 MG tablet Take 10 mg by mouth daily.    . sertraline (ZOLOFT) 50 MG tablet Take 50 mg by mouth daily.     No current facility-administered medications for this visit.    Family History  Problem Relation Age of Onset  . Depression Mother   . Osteoporosis Mother   . Stroke Father     ROS:  Pertinent items are noted in HPI.  Otherwise, a comprehensive ROS was negative.  Exam:   BP 124/80 mmHg  Pulse 60  Resp 16  Ht 5' 2.5" (1.588 m)  Wt 168 lb 9.6 oz (76.476 kg)  BMI 30.33 kg/m2  LMP 04/29/2007    General appearance: alert, cooperative and appears stated age Head: Normocephalic, without obvious abnormality, atraumatic Neck: no adenopathy, supple, symmetrical, trachea midline and thyroid normal to inspection and palpation Lungs: clear to auscultation bilaterally  Breasts: normal appearance, no masses or tenderness, Inspection negative, No nipple retraction or dimpling, No nipple discharge or bleeding, No axillary or supraclavicular adenopathy on left breast.  Right breast and right axillary region with scars and retraction.  No dominant masses, no nipple discharge, no adenopathy of axillary region.  Heart: regular rate and rhythm Abdomen: soft, non-tender; bowel sounds normal; no masses,  no organomegaly Extremities: extremities normal, atraumatic, no cyanosis or edema Skin: Skin color, texture, turgor normal. No rashes or lesions.  Multiple freckles and signs of sun exposure. Lymph nodes: Cervical, supraclavicular, and axillary nodes normal. No abnormal inguinal nodes palpated Neurologic: Grossly normal  Pelvic: External  genitalia:  no lesions              Urethra:  normal appearing urethra with no masses, tenderness or lesions              Bartholins and Skenes: normal                 Vagina: normal appearing vagina with normal color and discharge, no lesions              Cervix: no lesions              Pap taken: Yes.   Bimanual Exam:  Uterus:  normal size, contour, position, consistency, mobility, non-tender              Adnexa: normal adnexa and no mass, fullness, tenderness              Rectovaginal: Yes.  .  Confirms.              Anus:  normal sphincter tone, no lesions  Chaperone was present for exam.  Assessment:   Well woman visit with normal exam. Right breast cancer.  Osteopenia.  Caregiver stress.   Plan: Yearly mammogram recommended after age 37.  Patient doing diagnostic mammograms yearly.  Recommended self breast exam.  Pap and HR HPV as above. Discussed Calcium, Vitamin D, regular exercise program including cardiovascular and weight bearing exercise. Labs performed.  No..     Refills given on medications.  No..    Support given.  Name of Marya Amsler to patient.  Colonoscopy recommended.  Dermatology visit for skin check recommended. Follow up annually and prn.      After visit summary provided.

## 2014-10-25 NOTE — Patient Instructions (Signed)

## 2014-10-27 LAB — IPS PAP TEST WITH HPV

## 2014-11-02 ENCOUNTER — Other Ambulatory Visit: Payer: Self-pay | Admitting: Vascular Surgery

## 2014-11-02 DIAGNOSIS — Z853 Personal history of malignant neoplasm of breast: Secondary | ICD-10-CM

## 2015-04-29 DIAGNOSIS — M8580 Other specified disorders of bone density and structure, unspecified site: Secondary | ICD-10-CM

## 2015-04-29 HISTORY — DX: Other specified disorders of bone density and structure, unspecified site: M85.80

## 2015-06-19 DIAGNOSIS — Z853 Personal history of malignant neoplasm of breast: Secondary | ICD-10-CM

## 2015-06-19 DIAGNOSIS — M8580 Other specified disorders of bone density and structure, unspecified site: Secondary | ICD-10-CM

## 2015-10-31 ENCOUNTER — Other Ambulatory Visit: Payer: Self-pay | Admitting: Vascular Surgery

## 2015-10-31 DIAGNOSIS — Z853 Personal history of malignant neoplasm of breast: Secondary | ICD-10-CM

## 2015-11-02 ENCOUNTER — Ambulatory Visit (INDEPENDENT_AMBULATORY_CARE_PROVIDER_SITE_OTHER): Payer: BLUE CROSS/BLUE SHIELD | Admitting: Obstetrics and Gynecology

## 2015-11-02 ENCOUNTER — Encounter: Payer: Self-pay | Admitting: Obstetrics and Gynecology

## 2015-11-02 VITALS — BP 122/80 | HR 66 | Resp 14 | Ht 62.5 in | Wt 177.6 lb

## 2015-11-02 DIAGNOSIS — Z119 Encounter for screening for infectious and parasitic diseases, unspecified: Secondary | ICD-10-CM | POA: Diagnosis not present

## 2015-11-02 DIAGNOSIS — Z0000 Encounter for general adult medical examination without abnormal findings: Secondary | ICD-10-CM

## 2015-11-02 DIAGNOSIS — Z113 Encounter for screening for infections with a predominantly sexual mode of transmission: Secondary | ICD-10-CM

## 2015-11-02 DIAGNOSIS — Z01419 Encounter for gynecological examination (general) (routine) without abnormal findings: Secondary | ICD-10-CM | POA: Diagnosis not present

## 2015-11-02 DIAGNOSIS — R8290 Unspecified abnormal findings in urine: Secondary | ICD-10-CM

## 2015-11-02 LAB — POCT URINALYSIS DIPSTICK
BILIRUBIN UA: NEGATIVE
Glucose, UA: NEGATIVE
Ketones, UA: NEGATIVE
Nitrite, UA: NEGATIVE
Protein, UA: NEGATIVE
RBC UA: NEGATIVE
Urobilinogen, UA: NEGATIVE
pH, UA: 7

## 2015-11-02 NOTE — Progress Notes (Signed)
60 y.o. G28P3003 Married Caucasian female here for annual exam.    Husband's health is now better.   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 Has had a lot of joint pain.  Feels better after taking Aleve.  Taking amoxicillin for dental infection.   Now is taking Fosamax for osteopenia.  Dr. Hinton Rao oncology is prescribing this.  PCP:   Dr. Kennith Maes  Patient's last menstrual period was 04/29/2007.           Sexually active: No.  The current method of family planning is post menopausal status.    Exercising: No.  The patient does not participate in regular exercise at present. Smoker:  no  Health Maintenance: Pap:  10/25/2014 negative, HR HPV negative  History of abnormal Pap:  no MMG:  10/23/2014 BIRADS 2 benign, appointment for 11/23/2015  Colonoscopy:  03/28/2009 polyps, pt states she is overdue. Dr. Melina Copa, [place].  BMD:   06/2015 at Dr. Greggory Keen office  Result: Osteopenia  TDaP:  Pt unsure, ~5 years ago  Gardasil:   no HIV: pt states she has never been screened Hep C: drawn today  Screening Labs:  Hb today: PCP, Urine today: large WBC's    reports that she has never smoked. She has never used smokeless tobacco. She reports that she does not drink alcohol or use illicit drugs.  Past Medical History  Diagnosis Date  . Reflux   . Hyperlipidemia   . Cancer [facility]) 2014    Breast Cancer  . Hypothyroid     has thyroid nodule follow by [facility] yearly    Past Surgical History  Procedure Laterality Date  . Hyperlipid    . Tubal ligation  1982  . Wrist surgery  05/2013  . Breast surgery  09/2012    Due to Breast Cancer    Current Outpatient Prescriptions  Medication Sig Dispense Refill  . alendronate (FOSAMAX) 70 MG tablet   4  . amoxicillin-clavulanate (AUGMENTIN) 875-125 MG tablet   0  . anastrozole (ARIMIDEX) 1 MG tablet Take 1 mg by mouth daily.     Marland Kitchen aspirin 81 MG tablet Take 81 mg by mouth daily.    . B Complex Vitamins (B COMPLEX-B12 PO) Take 1 capsule by mouth daily.     Marland Kitchen Bioflavonoid Products (BIOFLEX) TABS Take by mouth daily.    . cholecalciferol (VITAMIN D) 1000 UNITS tablet Take 1,000 Units by mouth daily.    . meloxicam (MOBIC) 15 MG tablet Take 15 mg by mouth daily.    . Multiple Vitamin (MULTIVITAMIN) tablet Take 1 tablet by mouth daily.    Marland Kitchen PRILOSEC OTC 20 MG tablet Take 20 mg by mouth daily.     . Probiotic Product (PROBIOTIC PO) Take 2 capsules by mouth.    . rosuvastatin (CRESTOR) 10 MG tablet Take 10 mg by mouth daily.    . sertraline (ZOLOFT) 50 MG tablet Take 50 mg by mouth daily.    . cetirizine (ZYRTEC) 10 MG tablet Take 10 mg by mouth daily. Reported on 11/02/2015     No current facility-administered medications for this visit.    Family History  Problem Relation Age of Onset  . Depression Mother   . Osteoporosis Mother   . Stroke Father     ROS:  Pertinent items are noted in HPI.  Otherwise, a comprehensive ROS was negative.  Exam:   BP 122/80 mmHg  Pulse 66  Resp 14  Ht 5' 2.5" (1.588 m)  Wt 177 lb 9.6 oz (  80.559 kg)  BMI 31.95 kg/m2  LMP 04/29/2007    General appearance: alert, cooperative and appears stated age Head: Normocephalic, without obvious abnormality, atraumatic Neck: no adenopathy, supple, symmetrical, trachea midline and thyroid normal to inspection and palpation Lungs: clear to auscultation bilaterally Breasts: normal appearance, no masses or tenderness, Inspection negative, No nipple retraction or dimpling, No nipple discharge or bleeding, No axillary or supraclavicular adenopathy on left.  Right breast with scar and retraction around scar.No masses, adenopathy or nipple discharge. Heart: regular rate and rhythm Abdomen: soft, non-tender; no masses, no organomegaly Extremities: extremities normal, atraumatic, no cyanosis or edema Skin: Skin color, texture, turgor normal. No rashes or lesions Lymph nodes: Cervical, supraclavicular, and axillary nodes normal. No abnormal inguinal nodes palpated Neurologic:  Grossly normal  Pelvic: External genitalia:  no lesions              Urethra:  normal appearing urethra with no masses, tenderness or lesions              Bartholins and Skenes: normal                 Vagina: normal appearing vagina with normal color and discharge, no lesions              Cervix: no lesions              Pap taken: No. Bimanual Exam:  Uterus:  normal size, contour, position, consistency, mobility, non-tender              Adnexa: normal adnexa and no mass, fullness, tenderness              Rectal exam: Yes.  .  Confirms.              Anus:  normal sphincter tone, no lesions  Chaperone was present for exam.  Assessment:   Well woman visit with normal exam. Right breast cancer.  Osteopenia. On Fosamax. Abnormal urine.  Hx thyroid nodule.  Following through PCP.    Plan: Yearly mammogram recommended after age 51.  Recommended self breast exam.  Pap and HR HPV as above. Discussed Calcium, Vitamin D, regular exercise program including cardiovascular and weight bearing exercise. Labs performed.  Yes.  .   See orders.  Hep C aby, urine micro and culture. Prescription medication(s) given.  No..   I discussed Fosamax as a possible etiology for her musculoskeletal discomfort.  She will follow up with her oncologist.  Follow up annually and prn.       After visit summary provided.

## 2015-11-02 NOTE — Patient Instructions (Signed)
Health Maintenance, Female Adopting a healthy lifestyle and getting preventive care can go a long way to promote health and wellness. Talk with your health care provider about what schedule of regular examinations is right for you. This is a good chance for you to check in with your provider about disease prevention and staying healthy. In between checkups, there are plenty of things you can do on your own. Experts have done a lot of research about which lifestyle changes and preventive measures are most likely to keep you healthy. Ask your health care provider for more information. WEIGHT AND DIET  Eat a healthy diet  Be sure to include plenty of vegetables, fruits, low-fat dairy products, and lean protein.  Do not eat a lot of foods high in solid fats, added sugars, or salt.  Get regular exercise. This is one of the most important things you can do for your health.  Most adults should exercise for at least 150 minutes each week. The exercise should increase your heart rate and make you sweat (moderate-intensity exercise).  Most adults should also do strengthening exercises at least twice a week. This is in addition to the moderate-intensity exercise.  Maintain a healthy weight  Body mass index (BMI) is a measurement that can be used to identify possible weight problems. It estimates body fat based on height and weight. Your health care provider can help determine your BMI and help you achieve or maintain a healthy weight.  For females 20 years of age and older:   A BMI below 18.5 is considered underweight.  A BMI of 18.5 to 24.9 is normal.  A BMI of 25 to 29.9 is considered overweight.  A BMI of 30 and above is considered obese.  Watch levels of cholesterol and blood lipids  You should start having your blood tested for lipids and cholesterol at 62 years of age, then have this test every 5 years.  You may need to have your cholesterol levels checked more often if:  Your lipid  or cholesterol levels are high.  You are older than 62 years of age.  You are at high risk for heart disease.  CANCER SCREENING   Lung Cancer  Lung cancer screening is recommended for adults 55-80 years old who are at high risk for lung cancer because of a history of smoking.  A yearly low-dose CT scan of the lungs is recommended for people who:  Currently smoke.  Have quit within the past 15 years.  Have at least a 30-pack-year history of smoking. A pack year is smoking an average of one pack of cigarettes a day for 1 year.  Yearly screening should continue until it has been 15 years since you quit.  Yearly screening should stop if you develop a health problem that would prevent you from having lung cancer treatment.  Breast Cancer  Practice breast self-awareness. This means understanding how your breasts normally appear and feel.  It also means doing regular breast self-exams. Let your health care provider know about any changes, no matter how small.  If you are in your 20s or 30s, you should have a clinical breast exam (CBE) by a health care provider every 1-3 years as part of a regular health exam.  If you are 40 or older, have a CBE every year. Also consider having a breast X-ray (mammogram) every year.  If you have a family history of breast cancer, talk to your health care provider about genetic screening.  If you   are at high risk for breast cancer, talk to your health care provider about having an MRI and a mammogram every year.  Breast cancer gene (BRCA) assessment is recommended for women who have family members with BRCA-related cancers. BRCA-related cancers include:  Breast.  Ovarian.  Tubal.  Peritoneal cancers.  Results of the assessment will determine the need for genetic counseling and BRCA1 and BRCA2 testing. Cervical Cancer Your health care provider may recommend that you be screened regularly for cancer of the pelvic organs (ovaries, uterus, and  vagina). This screening involves a pelvic examination, including checking for microscopic changes to the surface of your cervix (Pap test). You may be encouraged to have this screening done every 3 years, beginning at age 21.  For women ages 30-65, health care providers may recommend pelvic exams and Pap testing every 3 years, or they may recommend the Pap and pelvic exam, combined with testing for human papilloma virus (HPV), every 5 years. Some types of HPV increase your risk of cervical cancer. Testing for HPV may also be done on women of any age with unclear Pap test results.  Other health care providers may not recommend any screening for nonpregnant women who are considered low risk for pelvic cancer and who do not have symptoms. Ask your health care provider if a screening pelvic exam is right for you.  If you have had past treatment for cervical cancer or a condition that could lead to cancer, you need Pap tests and screening for cancer for at least 20 years after your treatment. If Pap tests have been discontinued, your risk factors (such as having a new sexual partner) need to be reassessed to determine if screening should resume. Some women have medical problems that increase the chance of getting cervical cancer. In these cases, your health care provider may recommend more frequent screening and Pap tests. Colorectal Cancer  This type of cancer can be detected and often prevented.  Routine colorectal cancer screening usually begins at 62 years of age and continues through 62 years of age.  Your health care provider may recommend screening at an earlier age if you have risk factors for colon cancer.  Your health care provider may also recommend using home test kits to check for hidden blood in the stool.  A small camera at the end of a tube can be used to examine your colon directly (sigmoidoscopy or colonoscopy). This is done to check for the earliest forms of colorectal  cancer.  Routine screening usually begins at age 50.  Direct examination of the colon should be repeated every 5-10 years through 62 years of age. However, you may need to be screened more often if early forms of precancerous polyps or small growths are found. Skin Cancer  Check your skin from head to toe regularly.  Tell your health care provider about any new moles or changes in moles, especially if there is a change in a mole's shape or color.  Also tell your health care provider if you have a mole that is larger than the size of a pencil eraser.  Always use sunscreen. Apply sunscreen liberally and repeatedly throughout the day.  Protect yourself by wearing long sleeves, pants, a wide-brimmed hat, and sunglasses whenever you are outside. HEART DISEASE, DIABETES, AND HIGH BLOOD PRESSURE   High blood pressure causes heart disease and increases the risk of stroke. High blood pressure is more likely to develop in:  People who have blood pressure in the high end   of the normal range (130-139/85-89 mm Hg).  People who are overweight or obese.  People who are African American.  If you are 38-23 years of age, have your blood pressure checked every 3-5 years. If you are 61 years of age or older, have your blood pressure checked every year. You should have your blood pressure measured twice--once when you are at a hospital or clinic, and once when you are not at a hospital or clinic. Record the average of the two measurements. To check your blood pressure when you are not at a hospital or clinic, you can use:  An automated blood pressure machine at a pharmacy.  A home blood pressure monitor.  If you are between 45 years and 39 years old, ask your health care provider if you should take aspirin to prevent strokes.  Have regular diabetes screenings. This involves taking a blood sample to check your fasting blood sugar level.  If you are at a normal weight and have a low risk for diabetes,  have this test once every three years after 62 years of age.  If you are overweight and have a high risk for diabetes, consider being tested at a younger age or more often. PREVENTING INFECTION  Hepatitis B  If you have a higher risk for hepatitis B, you should be screened for this virus. You are considered at high risk for hepatitis B if:  You were born in a country where hepatitis B is common. Ask your health care provider which countries are considered high risk.  Your parents were born in a high-risk country, and you have not been immunized against hepatitis B (hepatitis B vaccine).  You have HIV or AIDS.  You use needles to inject street drugs.  You live with someone who has hepatitis B.  You have had sex with someone who has hepatitis B.  You get hemodialysis treatment.  You take certain medicines for conditions, including cancer, organ transplantation, and autoimmune conditions. Hepatitis C  Blood testing is recommended for:  Everyone born from [birth year] through 1967.  Anyone with known risk factors for hepatitis C. Sexually transmitted infections (STIs)  You should be screened for sexually transmitted infections (STIs) including gonorrhea and chlamydia if:  You are sexually active and are younger than 62 years of age.  You are older than 62 years of age and your health care provider tells you that you are at risk for this type of infection.  Your sexual activity has changed since you were last screened and you are at an increased risk for chlamydia or gonorrhea. Ask your health care provider if you are at risk.  If you do not have HIV, but are at risk, it may be recommended that you take a prescription medicine daily to prevent HIV infection. This is called pre-exposure prophylaxis (PrEP). You are considered at risk if:  You are sexually active and do not regularly use condoms or know the HIV status of your partner(s).  You take drugs by injection.  You are sexually  active with a partner who has HIV. Talk with your health care provider about whether you are at high risk of being infected with HIV. If you choose to begin PrEP, you should first be tested for HIV. You should then be tested every 3 months for as long as you are taking PrEP.  PREGNANCY   If you are premenopausal and you may become pregnant, ask your health care provider about preconception counseling.  If you may  become pregnant, take 400 to 800 micrograms (mcg) of folic acid every day.  If you want to prevent pregnancy, talk to your health care provider about birth control (contraception). OSTEOPOROSIS AND MENOPAUSE   Osteoporosis is a disease in which the bones lose minerals and strength with aging. This can result in serious bone fractures. Your risk for osteoporosis can be identified using a bone density scan.  If you are 39 years of age or older, or if you are at risk for osteoporosis and fractures, ask your health care provider if you should be screened.  Ask your health care provider whether you should take a calcium or vitamin D supplement to lower your risk for osteoporosis.  Menopause may have certain physical symptoms and risks.  Hormone replacement therapy may reduce some of these symptoms and risks. Talk to your health care provider about whether hormone replacement therapy is right for you.  HOME CARE INSTRUCTIONS   Schedule regular health, dental, and eye exams.  Stay current with your immunizations.   Do not use any tobacco products including cigarettes, chewing tobacco, or electronic cigarettes.  If you are pregnant, do not drink alcohol.  If you are breastfeeding, limit how much and how often you drink alcohol.  Limit alcohol intake to no more than 1 drink per day for nonpregnant women. One drink equals 12 ounces of beer, 5 ounces of wine, or 1 ounces of hard liquor.  Do not use street drugs.  Do not share needles.  Ask your health care provider for help if  you need support or information about quitting drugs.  Tell your health care provider if you often feel depressed.  Tell your health care provider if you have ever been abused or do not feel safe at home.   This information is not intended to replace advice given to you by your health care provider. Make sure you discuss any questions you have with your health care provider.   Document Released: 10/28/2010 Document Revised: 05/05/2014 Document Reviewed: 03/16/2013 Elsevier Interactive Patient Education 2016 Elsevier Inc.  Alendronate tablets What is this medicine? ALENDRONATE (a LEN droe nate) slows calcium loss from bones. It helps to make normal healthy bone and to slow bone loss in people with Paget's disease and osteoporosis. It may be used in others at risk for bone loss. This medicine may be used for other purposes; ask your health care provider or pharmacist if you have questions. What should I tell my health care provider before I take this medicine? They need to know if you have any of these conditions: -dental disease -esophagus, stomach, or intestine problems, like acid reflux or GERD -kidney disease -low blood calcium -low vitamin D -problems sitting or standing 30 minutes -trouble swallowing -an unusual or allergic reaction to alendronate, other medicines, foods, dyes, or preservatives -pregnant or trying to get pregnant -breast-feeding How should I use this medicine? You must take this medicine exactly as directed or you will lower the amount of the medicine you absorb into your body or you may cause yourself harm. Take this medicine by mouth first thing in the morning, after you are up for the day. Do not eat or drink anything before you take your medicine. Swallow the tablet with a full glass (6 to 8 fluid ounces) of plain water. Do not take this medicine with any other drink. Do not chew or crush the tablet. After taking this medicine, do not eat breakfast, drink, or  take any medicines or vitamins  for at least 30 minutes. Sit or stand up for at least 30 minutes after you take this medicine; do not lie down. Do not take your medicine more often than directed. Talk to your pediatrician regarding the use of this medicine in children. Special care may be needed. Overdosage: If you think you have taken too much of this medicine contact a poison control center or emergency room at once. NOTE: This medicine is only for you. Do not share this medicine with others. What if I miss a dose? If you miss a dose, do not take it later in the day. Continue your normal schedule starting the next morning. Do not take double or extra doses. What may interact with this medicine? -aluminum hydroxide -antacids -aspirin -calcium supplements -drugs for inflammation like ibuprofen, naproxen, and others -iron supplements -magnesium supplements -vitamins with minerals This list may not describe all possible interactions. Give your health care provider a list of all the medicines, herbs, non-prescription drugs, or dietary supplements you use. Also tell them if you smoke, drink alcohol, or use illegal drugs. Some items may interact with your medicine. What should I watch for while using this medicine? Visit your doctor or health care professional for regular checks ups. It may be some time before you see benefit from this medicine. Do not stop taking your medicine except on your doctor's advice. Your doctor or health care professional may order blood tests and other tests to see how you are doing. You should make sure you get enough calcium and vitamin D while you are taking this medicine, unless your doctor tells you not to. Discuss the foods you eat and the vitamins you take with your health care professional. Some people who take this medicine have severe bone, joint, and/or muscle pain. This medicine may also increase your risk for a broken thigh bone. Tell your doctor right away if  you have pain in your upper leg or groin. Tell your doctor if you have any pain that does not go away or that gets worse. This medicine can make you more sensitive to the sun. If you get a rash while taking this medicine, sunlight may cause the rash to get worse. Keep out of the sun. If you cannot avoid being in the sun, wear protective clothing and use sunscreen. Do not use sun lamps or tanning beds/booths. What side effects may I notice from receiving this medicine? Side effects that you should report to your doctor or health care professional as soon as possible: -allergic reactions like skin rash, itching or hives, swelling of the face, lips, or tongue -black or tarry stools -bone, muscle or joint pain -changes in vision -chest pain -heartburn or stomach pain -jaw pain, especially after dental work -pain or trouble when swallowing -redness, blistering, peeling or loosening of the skin, including inside the mouth Side effects that usually do not require medical attention (report to your doctor or health care professional if they continue or are bothersome): -changes in taste -diarrhea or constipation -eye pain or itching -headache -nausea or vomiting -stomach gas or fullness This list may not describe all possible side effects. Call your doctor for medical advice about side effects. You may report side effects to FDA at 1-800-FDA-1088. Where should I keep my medicine? Keep out of the reach of children. Store at room temperature of 15 and 30 degrees C (59 and 86 degrees F). Throw away any unused medicine after the expiration date. NOTE: This sheet is a summary. It  may not cover all possible information. If you have questions about this medicine, talk to your doctor, pharmacist, or health care provider.    2016, Elsevier/Gold Standard. (2010-10-11 08:56:09)

## 2015-11-03 LAB — URINALYSIS, MICROSCOPIC ONLY
Bacteria, UA: NONE SEEN [HPF]
CASTS: NONE SEEN [LPF]
Crystals: NONE SEEN [HPF]
RBC / HPF: NONE SEEN RBC/HPF (ref <=2)
SQUAMOUS EPITHELIAL / LPF: NONE SEEN [HPF] (ref <=5)
Yeast: NONE SEEN [HPF]

## 2015-11-03 LAB — HEPATITIS C ANTIBODY: HCV Ab: NEGATIVE

## 2015-11-04 LAB — URINE CULTURE
COLONY COUNT: NO GROWTH
Organism ID, Bacteria: NO GROWTH

## 2015-11-05 ENCOUNTER — Telehealth: Payer: Self-pay

## 2015-11-05 NOTE — Telephone Encounter (Signed)
-----   Message from Nunzio Cobbs, MD sent at 11/04/2015  3:13 PM EDT ----- Please inform patient of all negative urine and hep C testing.

## 2015-11-05 NOTE — Telephone Encounter (Signed)
Called patient at [phone] to discuss negative urine culture and negative Hep C, left message to call me.

## 2015-11-05 NOTE — Telephone Encounter (Signed)
Pt notified.  Verbalized understanding.

## 2015-11-13 ENCOUNTER — Encounter: Payer: Self-pay | Admitting: Obstetrics and Gynecology

## 2015-11-23 ENCOUNTER — Ambulatory Visit
Admission: RE | Admit: 2015-11-23 | Discharge: 2015-11-23 | Disposition: A | Discharge status: Home / Self Care | Payer: BLUE CROSS/BLUE SHIELD | Source: Ambulatory Visit | Attending: Vascular Surgery | Admitting: Vascular Surgery

## 2015-11-23 DIAGNOSIS — Z853 Personal history of malignant neoplasm of breast: Secondary | ICD-10-CM

## 2015-12-03 DIAGNOSIS — D499 Neoplasm of unspecified behavior of unspecified site: Secondary | ICD-10-CM | POA: Insufficient documentation

## 2015-12-03 DIAGNOSIS — Z170 Estrogen receptor positive status [ER+]: Secondary | ICD-10-CM

## 2015-12-03 DIAGNOSIS — C50411 Malignant neoplasm of upper-outer quadrant of right female breast: Secondary | ICD-10-CM | POA: Insufficient documentation

## 2015-12-03 DIAGNOSIS — Z6832 Body mass index (BMI) 32.0-32.9, adult: Secondary | ICD-10-CM | POA: Insufficient documentation

## 2015-12-14 ENCOUNTER — Other Ambulatory Visit: Payer: Self-pay | Admitting: Vascular Surgery

## 2015-12-14 DIAGNOSIS — Z853 Personal history of malignant neoplasm of breast: Secondary | ICD-10-CM

## 2016-06-24 DIAGNOSIS — M810 Age-related osteoporosis without current pathological fracture: Secondary | ICD-10-CM | POA: Diagnosis not present

## 2016-06-24 DIAGNOSIS — C50911 Malignant neoplasm of unspecified site of right female breast: Secondary | ICD-10-CM | POA: Diagnosis not present

## 2016-06-24 DIAGNOSIS — Z79811 Long term (current) use of aromatase inhibitors: Secondary | ICD-10-CM | POA: Diagnosis not present

## 2016-06-24 DIAGNOSIS — Z170 Estrogen receptor positive status [ER+]: Secondary | ICD-10-CM

## 2016-11-07 ENCOUNTER — Ambulatory Visit (INDEPENDENT_AMBULATORY_CARE_PROVIDER_SITE_OTHER): Payer: BLUE CROSS/BLUE SHIELD | Admitting: Obstetrics and Gynecology

## 2016-11-07 ENCOUNTER — Encounter: Payer: Self-pay | Admitting: Obstetrics and Gynecology

## 2016-11-07 VITALS — BP 122/70 | HR 64 | Resp 16 | Ht 62.25 in | Wt 176.0 lb

## 2016-11-07 DIAGNOSIS — Z01419 Encounter for gynecological examination (general) (routine) without abnormal findings: Secondary | ICD-10-CM

## 2016-11-07 NOTE — Patient Instructions (Signed)

## 2016-11-07 NOTE — Progress Notes (Signed)
61 y.o. G66P3003 Married Caucasian female here for annual exam.    On Arimidex for her breast cancer.  Will continue this for one more year.  Sees Dr. Hinton Rao.   Not sleeping as well.   Stopped Fosamax due to musculoskeletal pain.  Now has osteoporosis and is using Calcitonin.   Has a swimming pool at home.  Gets a lot os sun.  Does see Dr. Allyson Sabal.   PCP: [facility] in [place]    Patient's last menstrual period was 04/29/2007.           Sexually active: No.  The current method of family planning is post menopausal status.    Exercising: No.  The patient does not participate in regular exercise at present.  Has an elliptical and a bike at the house.  Smoker:  no  Health Maintenance: Pap:  10/25/2014 negative, HR HPV negative  History of abnormal Pap:  no MMG:  11/23/15 BIRADS 2 benign/density c -- scheduled 11/24/16 TBC Colonoscopy:  03/28/2009 polyps, Dr. Melina Copa, [facility].  Had May or June, 2018 - normal.  BMD:   06/2015 at Dr. Greggory Keen office Result  Osteopenia TDaP:  Unsure.  Thinks it was 2011. Hep C: 11/02/15 Negative Screening Labs: PCP takes care of labs   reports that she has never smoked. She has never used smokeless tobacco. She reports that she does not drink alcohol or use drugs.  Past Medical History:  Diagnosis Date  . Cancer [facility]) 2014   Breast Cancer  . Hyperlipidemia   . Hypothyroid    has thyroid nodule follow by [facility] yearly  . Osteopenia 2017   T score spine - 2.3, right femoral neck -1.6, total right hip - 0.9.  Marland Kitchen Reflux     Past Surgical History:  Procedure Laterality Date  . BREAST SURGERY  09/2012   Due to Breast Cancer  . hyperlipid    . TUBAL LIGATION  1982  . WRIST SURGERY  05/2013    Current Outpatient Prescriptions  Medication Sig Dispense Refill  . anastrozole (ARIMIDEX) 1 MG tablet Take 1 mg by mouth daily.     . B Complex Vitamins (B COMPLEX-B12 PO) Take 1 capsule by mouth daily.    Marland Kitchen Bioflavonoid Products (BIOFLEX) TABS Take by mouth  daily.    . calcitonin, salmon, (MIACALCIN/FORTICAL) 200 UNIT/ACT nasal spray Place 1 spray into alternate nostrils daily.    . DiphenhydrAMINE HCl (BENADRYL ALLERGY PO) Take by mouth as needed.    . meloxicam (MOBIC) 15 MG tablet Take 15 mg by mouth daily.    . Multiple Vitamin (MULTIVITAMIN) tablet Take 1 tablet by mouth daily.    Marland Kitchen PRILOSEC OTC 20 MG tablet Take 20 mg by mouth daily.     . Probiotic Product (PROBIOTIC PO) Take 2 capsules by mouth.    . rosuvastatin (CRESTOR) 10 MG tablet Take 10 mg by mouth daily.    . sertraline (ZOLOFT) 50 MG tablet Take 50 mg by mouth daily.     No current facility-administered medications for this visit.     Family History  Problem Relation Age of Onset  . Depression Mother   . Osteoporosis Mother   . Stroke Father     ROS:  Pertinent items are noted in HPI.  Otherwise, a comprehensive ROS was negative.  Exam:   BP 122/70 (BP Location: Right Arm, Patient Position: Sitting, Cuff Size: Normal)   Pulse 64   Resp 16   Ht 5' 2.25" (1.581 m)   Wt  176 lb (79.8 kg)   LMP 04/29/2007   BMI 31.93 kg/m     General appearance: alert, cooperative and appears stated age Head: Normocephalic, without obvious abnormality, atraumatic Neck: no adenopathy, supple, symmetrical, trachea midline and thyroid normal to inspection and palpation Lungs: clear to auscultation bilaterally Breasts: right breast scar and left breast normal appearance, no masses or tenderness, No nipple retraction or dimpling, No nipple discharge or bleeding, No axillary or supraclavicular adenopathy Heart: regular rate and rhythm Abdomen: soft, non-tender; no masses, no organomegaly Extremities: extremities normal, atraumatic, no cyanosis or edema Skin: Skin color, texture, turgor normal. No rashes or lesions Lymph nodes: Cervical, supraclavicular, and axillary nodes normal. No abnormal inguinal nodes palpated Neurologic: Grossly normal  Pelvic: External genitalia:  no lesions               Urethra:  normal appearing urethra with no masses, tenderness or lesions              Bartholins and Skenes: normal                 Vagina: normal appearing vagina with normal color and discharge, no lesions              Cervix: no lesions              Pap taken: No. Bimanual Exam:  Uterus:  normal size, contour, position, consistency, mobility, non-tender              Adnexa: no mass, fullness, tenderness              Rectal exam: Yes.  .  Confirms.              Anus:  normal sphincter tone, no lesions  Chaperone was present for exam.  Assessment:   Well woman visit with normal exam. Right breast cancer.  Osteoporosis.  On Calcitonin. Hx thyroid nodule.  Followed by PCP.   Plan: Mammogram screening discussed. Recommended self breast awareness. Pap and HR HPV as above. Guidelines for Calcium, Vitamin D, regular exercise program including cardiovascular and weight bearing exercise. Labs with PCP.  Follow up annually and prn.   After visit summary provided.

## 2016-12-04 ENCOUNTER — Other Ambulatory Visit: Payer: Self-pay | Admitting: Family Medicine

## 2016-12-09 ENCOUNTER — Ambulatory Visit
Admission: RE | Admit: 2016-12-09 | Discharge: 2016-12-09 | Disposition: A | Discharge status: Home / Self Care | Payer: BLUE CROSS/BLUE SHIELD | Source: Ambulatory Visit | Attending: Vascular Surgery | Admitting: Vascular Surgery

## 2016-12-09 DIAGNOSIS — Z853 Personal history of malignant neoplasm of breast: Secondary | ICD-10-CM

## 2016-12-09 HISTORY — DX: Personal history of irradiation: Z92.3

## 2016-12-23 DIAGNOSIS — C50911 Malignant neoplasm of unspecified site of right female breast: Secondary | ICD-10-CM

## 2016-12-23 DIAGNOSIS — M810 Age-related osteoporosis without current pathological fracture: Secondary | ICD-10-CM

## 2016-12-23 DIAGNOSIS — Z170 Estrogen receptor positive status [ER+]: Secondary | ICD-10-CM | POA: Diagnosis not present

## 2016-12-23 DIAGNOSIS — Z79811 Long term (current) use of aromatase inhibitors: Secondary | ICD-10-CM | POA: Diagnosis not present

## 2017-04-30 ENCOUNTER — Other Ambulatory Visit: Payer: Self-pay | Admitting: Vascular Surgery

## 2017-04-30 DIAGNOSIS — Z9889 Other specified postprocedural states: Secondary | ICD-10-CM

## 2017-11-18 ENCOUNTER — Encounter: Payer: Self-pay | Admitting: Obstetrics and Gynecology

## 2017-11-18 ENCOUNTER — Other Ambulatory Visit: Payer: Self-pay

## 2017-11-18 ENCOUNTER — Ambulatory Visit (INDEPENDENT_AMBULATORY_CARE_PROVIDER_SITE_OTHER): Payer: BLUE CROSS/BLUE SHIELD | Admitting: Obstetrics and Gynecology

## 2017-11-18 ENCOUNTER — Other Ambulatory Visit (HOSPITAL_COMMUNITY)
Admission: RE | Admit: 2017-11-18 | Discharge: 2017-11-18 | Disposition: A | Discharge status: Home / Self Care | Payer: BLUE CROSS/BLUE SHIELD | Source: Ambulatory Visit | Attending: Obstetrics and Gynecology | Admitting: Obstetrics and Gynecology

## 2017-11-18 VITALS — BP 132/70 | HR 72 | Resp 16 | Ht 62.0 in | Wt 186.0 lb

## 2017-11-18 DIAGNOSIS — N3281 Overactive bladder: Secondary | ICD-10-CM | POA: Diagnosis not present

## 2017-11-18 DIAGNOSIS — N898 Other specified noninflammatory disorders of vagina: Secondary | ICD-10-CM | POA: Insufficient documentation

## 2017-11-18 DIAGNOSIS — R350 Frequency of micturition: Secondary | ICD-10-CM | POA: Diagnosis not present

## 2017-11-18 DIAGNOSIS — Z01419 Encounter for gynecological examination (general) (routine) without abnormal findings: Secondary | ICD-10-CM

## 2017-11-18 DIAGNOSIS — F439 Reaction to severe stress, unspecified: Secondary | ICD-10-CM

## 2017-11-18 LAB — POCT URINALYSIS DIPSTICK
Bilirubin, UA: NEGATIVE
Blood, UA: NEGATIVE
GLUCOSE UA: NEGATIVE
Ketones, UA: NEGATIVE
LEUKOCYTES UA: NEGATIVE
NITRITE UA: NEGATIVE
PROTEIN UA: NEGATIVE
Urobilinogen, UA: 0.2 E.U./dL
pH, UA: 8 (ref 5.0–8.0)

## 2017-11-18 MED ORDER — OXYBUTYNIN CHLORIDE ER 10 MG PO TB24: 10.0000 mg | ORAL_TABLET | Freq: Every day | ORAL | 1 refills | Status: AC

## 2017-11-18 NOTE — Progress Notes (Signed)
62 y.o. [id number] Married Caucasian female here for annual exam.   Last BMD osteopenia, so it is improved.   Urinary frequency and urgency.  Pain with urination.  Spontaneous leakage and with cough/sneeze. Empties 20 - 30 times a day. Wears a pad.  1 - 2 coffee per day.  1 soda every other day.  No prior medication use.   No cardiac arrhythmia or narrow angle glaucoma.   Some vaginal odor.   5 grandchildren.   Husband has been ill this year and she cares for him.  Really stressful situation.  Patient had to increase her Sertraline through her PCP.   Urine dip - negative.   PCP: Kennith Maes    Patient's last menstrual period was 04/29/2007.           Sexually active: No.  The current method of family planning is post menopausal status.    Exercising: No.  The patient does not participate in regular exercise at present. Smoker:  no  Health Maintenance:       Pap:  10/25/2014 negative, HR HPV negative  History of abnormal Pap:  no MMG:  12/09/16 BIRADS 2 Benign/density b Colonoscopy:  09/17/16 normal BMD:   June 2019  Result  Osteopenia per patient TDaP:  Possibly done with PCP 2018 Gardasil:   n/a HIV: donated blood about 2 years ago Hep C: 11/02/15 Negative Screening Labs: PCP   reports that she has never smoked. She has never used smokeless tobacco. She reports that she does not drink alcohol or use drugs.  Past Medical History:  Diagnosis Date  . Cancer [facility]) 2014   Breast Cancer  . Hyperlipidemia   . Hypothyroid    has thyroid nodule follow by [facility] yearly  . Osteopenia 2017   T score spine - 2.3, right femoral neck -1.6, total right hip - 0.9.  Marland Kitchen Personal history of radiation therapy 2014  . Reflux     Past Surgical History:  Procedure Laterality Date  . BREAST LUMPECTOMY Right 09/29/2012  . BREAST SURGERY  09/2012   Due to Breast Cancer  . hyperlipid    . TUBAL LIGATION  1982  . WRIST SURGERY  05/2013    Current Outpatient Medications  Medication Sig  Dispense Refill  . B Complex Vitamins (B COMPLEX-B12 PO) Take 1 capsule by mouth daily.    Marland Kitchen Bioflavonoid Products (BIOFLEX) TABS Take by mouth daily.    . calcitonin, salmon, (MIACALCIN/FORTICAL) 200 UNIT/ACT nasal spray Place 1 spray into alternate nostrils daily.    . Calcium Carb-Cholecalciferol (CALCIUM 1000 + D PO) Take by mouth daily.    . DiphenhydrAMINE HCl (BENADRYL ALLERGY PO) Take by mouth as needed.    . meloxicam (MOBIC) 15 MG tablet Take 15 mg by mouth daily.    . Multiple Vitamin (MULTIVITAMIN) tablet Take 1 tablet by mouth daily.    Marland Kitchen PRILOSEC OTC 20 MG tablet Take 20 mg by mouth daily.     . Probiotic Product (PROBIOTIC PO) Take 2 capsules by mouth.    . rosuvastatin (CRESTOR) 10 MG tablet Take 10 mg by mouth daily.    . sertraline (ZOLOFT) 50 MG tablet Take 50 mg by mouth daily.     No current facility-administered medications for this visit.     Family History  Problem Relation Age of Onset  . Depression Mother   . Osteoporosis Mother   . Stroke Father     Review of Systems  Constitutional:  Weight gain  HENT: Negative.   Eyes: Negative.   Respiratory: Negative.   Cardiovascular: Negative.   Gastrointestinal: Negative.   Endocrine: Negative.   Genitourinary: Positive for frequency and urgency.       Burning with urination Loss of urine spontaneously Loss of urine with sneeze or cough Night urination  Musculoskeletal: Negative.   Skin: Negative.   Allergic/Immunologic: Negative.   Neurological:       Lack of coordination Difficulty with memory or speech  Hematological: Negative.   Psychiatric/Behavioral: Negative.     Exam:   BP 132/70 (BP Location: Right Arm, Patient Position: Sitting, Cuff Size: Large)   Pulse 72   Resp 16   Ht 5\' 2"  (1.575 m)   Wt 186 lb (84.4 kg)   LMP 04/29/2007   BMI 34.02 kg/m     General appearance: alert, cooperative and appears stated age Head: Normocephalic, without obvious abnormality, atraumatic Neck: no  adenopathy, supple, symmetrical, trachea midline and thyroid normal to inspection and palpation Lungs: clear to auscultation bilaterally Breasts: normal appearance, no masses or tenderness, No nipple retraction or dimpling, No nipple discharge or bleeding, No axillary or supraclavicular adenopathy Heart: regular rate and rhythm Abdomen: soft, non-tender; no masses, no organomegaly Extremities: extremities normal, atraumatic, no cyanosis or edema Skin: Skin color, texture, turgor normal. No rashes or lesions Lymph nodes: Cervical, supraclavicular, and axillary nodes normal. No abnormal inguinal nodes palpated Neurologic: Grossly normal  Pelvic: External genitalia:  no lesions              Urethra:  normal appearing urethra with no masses, tenderness or lesions              Bartholins and Skenes: normal                 Vagina: normal appearing vagina with normal color and discharge, no lesions              Cervix: no lesions              Pap taken: Yes.   Bimanual Exam:  Uterus:  normal size, contour, position, consistency, mobility, non-tender              Adnexa: no mass, fullness, tenderness              Rectal exam: Yes.  .  Confirms.              Anus:  normal sphincter tone, no lesions  PVR - 212 cc.  Voided twice prior to this.   Chaperone was present for exam.  Assessment:   Well woman visit with normal exam.       Right breast cancer.  Off Arimidex early due to osteoporosis.        Osteoporosis.  On Calcitonin.       Hx thyroid nodule.  Followed by PCP.        Overactive bladder.         Situational stress.        Vaginal odor.   Plan: Mammogram screening. Recommended self breast awareness. Pap and HR HPV as above. Guidelines for Calcium, Vitamin D, regular exercise program including cardiovascular and weight bearing exercise. Overactive bladder discussed.   Bladder irritants reviewed.  Will start Ditropan Xl 10 mg daily.  #30, RF one.  Needs recheck in 6 weeks.  We  also discussed her caregiver status and the stress that is is placing on her life and ability to take care of  herself and her partner.  I do strongly recommend counseling and gave her the name of the [place] counseling group.  A support group may also be helpful. Vaginitis testing from pap today.  We discussed the various forms of vaginitis.  Follow up annually and prn.    ___25____ minutes face to face time of which over 50% was spent in counseling regarding overactive bladder, vaginal odor, and situational stress.  This was performed in addition to her annual exam.  One hour face to face time total for her visit today.    After visit summary provided.

## 2017-11-18 NOTE — Patient Instructions (Addendum)
EXERCISE AND DIET:  We recommended that you start or continue a regular exercise program for good health. Regular exercise means any activity that makes your heart beat faster and makes you sweat.  We recommend exercising at least 30 minutes per day at least 3 days a week, preferably 4 or 5.  We also recommend a diet low in fat and sugar.  Inactivity, poor dietary choices and obesity can cause diabetes, heart attack, stroke, and kidney damage, among others.    ALCOHOL AND SMOKING:  Women should limit their alcohol intake to no more than 7 drinks/beers/glasses of wine (combined, not each!) per week. Moderation of alcohol intake to this level decreases your risk of breast cancer and liver damage. And of course, no recreational drugs are part of a healthy lifestyle.  And absolutely no smoking or even second hand smoke. Most people know smoking can cause heart and lung diseases, but did you know it also contributes to weakening of your bones? Aging of your skin?  Yellowing of your teeth and nails?  CALCIUM AND VITAMIN D:  Adequate intake of calcium and Vitamin D are recommended.  The recommendations for exact amounts of these supplements seem to change often, but generally speaking 600 mg of calcium (either carbonate or citrate) and 800 units of Vitamin D per day seems prudent. Certain women may benefit from higher intake of Vitamin D.  If you are among these women, your doctor will have told you during your visit.    PAP SMEARS:  Pap smears, to check for cervical cancer or precancers,  have traditionally been done yearly, although recent scientific advances have shown that most women can have pap smears less often.  However, every woman still should have a physical exam from her gynecologist every year. It will include a breast check, inspection of the vulva and vagina to check for abnormal growths or skin changes, a visual exam of the cervix, and then an exam to evaluate the size and shape of the uterus and  ovaries.  And after 64 years of age, a rectal exam is indicated to check for rectal cancers. We will also provide age appropriate advice regarding health maintenance, like when you should have certain vaccines, screening for sexually transmitted diseases, bone density testing, colonoscopy, mammograms, etc.   MAMMOGRAMS:  All women over 40 years old should have a yearly mammogram. Many facilities now offer a "3D" mammogram, which may cost around $50 extra out of pocket. If possible,  we recommend you accept the option to have the 3D mammogram performed.  It both reduces the number of women who will be called back for extra views which then turn out to be normal, and it is better than the routine mammogram at detecting truly abnormal areas.    COLONOSCOPY:  Colonoscopy to screen for colon cancer is recommended for all women at age 50.  We know, you hate the idea of the prep.  We agree, BUT, having colon cancer and not knowing it is worse!!  Colon cancer so often starts as a polyp that can be seen and removed at colonscopy, which can quite literally save your life!  And if your first colonoscopy is normal and you have no family history of colon cancer, most women don't have to have it again for 10 years.  Once every ten years, you can do something that may end up saving your life, right?  We will be happy to help you get it scheduled when you are ready.    Be sure to check your insurance coverage so you understand how much it will cost.  It may be covered as a preventative service at no cost, but you should check your particular policy.     Oxybutynin extended-release tablets What is this medicine? OXYBUTYNIN (ox i BYOO ti nin) is used to treat overactive bladder. This medicine reduces the amount of bathroom visits. It may also help to control wetting accidents. This medicine may be used for other purposes; ask your health care provider or pharmacist if you have questions. COMMON BRAND NAME(S): Ditropan  XL What should I tell my health care provider before I take this medicine? They need to know if you have any of these conditions: -autonomic neuropathy -dementia -difficulty passing urine -glaucoma -intestinal obstruction -kidney disease -liver disease -myasthenia gravis -Parkinson's disease -an unusual or allergic reaction to oxybutynin, other medicines, foods, dyes, or preservatives -pregnant or trying to get pregnant -breast-feeding How should I use this medicine? Take this medicine by mouth with a glass of water. Swallow whole, do not crush, cut, or chew. Follow the directions on the prescription label. You can take this medicine with or without food. Take your doses at regular intervals. Do not take your medicine more often than directed. Talk to your pediatrician regarding the use of this medicine in children. Special care may be needed. While this drug may be prescribed for children as young as 6 years for selected conditions, precautions do apply. Overdosage: If you think you have taken too much of this medicine contact a poison control center or emergency room at once. NOTE: This medicine is only for you. Do not share this medicine with others. What if I miss a dose? If you miss a dose, take it as soon as you can. If it is almost time for your next dose, take only that dose. Do not take double or extra doses. What may interact with this medicine? -antihistamines for allergy, cough and cold -atropine -certain medicines for bladder problems like oxybutynin, tolterodine -certain medicines for Parkinson's disease like benztropine, trihexyphenidyl -certain medicines for stomach problems like dicyclomine, hyoscyamine -certain medicines for travel sickness like scopolamine -clarithromycin -erythromycin -ipratropium -medicines for fungal infections, like fluconazole, itraconazole, ketoconazole or voriconazole This list may not describe all possible interactions. Give your health  care provider a list of all the medicines, herbs, non-prescription drugs, or dietary supplements you use. Also tell them if you smoke, drink alcohol, or use illegal drugs. Some items may interact with your medicine. What should I watch for while using this medicine? It may take a few weeks to notice the full benefit from this medicine. You may need to limit your intake tea, coffee, caffeinated sodas, and alcohol. These drinks may make your symptoms worse. You may get drowsy or dizzy. Do not drive, use machinery, or do anything that needs mental alertness until you know how this medicine affects you. Do not stand or sit up quickly, especially if you are an older patient. This reduces the risk of dizzy or fainting spells. Alcohol may interfere with the effect of this medicine. Avoid alcoholic drinks. Your mouth may get dry. Chewing sugarless gum or sucking hard candy, and drinking plenty of water may help. Contact your doctor if the problem does not go away or is severe. This medicine may cause dry eyes and blurred vision. If you wear contact lenses, you may feel some discomfort. Lubricating drops may help. See your eyecare professional if the problem does not go away or is  severe. You may notice the shells of the tablets in your stool from time to time. This is normal. Avoid extreme heat. This medicine can cause you to sweat less than normal. Your body temperature could increase to dangerous levels, which may lead to heat stroke. What side effects may I notice from receiving this medicine? Side effects that you should report to your doctor or health care professional as soon as possible: -allergic reactions like skin rash, itching or hives, swelling of the face, lips, or tongue -agitation -breathing problems -confusion -fever -flushing (reddening of the skin) -hallucinations -memory loss -pain or difficulty passing urine -palpitations -unusually weak or tired Side effects that usually do not  require medical attention (report to your doctor or health care professional if they continue or are bothersome): -constipation -headache -sexual difficulties (impotence) This list may not describe all possible side effects. Call your doctor for medical advice about side effects. You may report side effects to FDA at 1-800-FDA-1088. Where should I keep my medicine? Keep out of the reach of children. Store at room temperature between 15 and 30 degrees C (59 and 86 degrees F). Protect from moisture and humidity. Throw away any unused medicine after the expiration date. NOTE: This sheet is a summary. It may not cover all possible information. If you have questions about this medicine, talk to your doctor, pharmacist, or health care provider.  2018 Elsevier/Gold Standard (2013-06-30 10:57:06)  Overactive Bladder, Adult Overactive bladder is a group of urinary symptoms. With overactive bladder, you may suddenly feel the need to pass urine (urinate) right away. After feeling this sudden urge, you might also leak urine if you cannot get to the bathroom fast enough (urinary incontinence). These symptoms might interfere with your daily work or social activities. Overactive bladder symptoms may also wake you up at night. Overactive bladder affects the nerve signals between your bladder and your brain. Your bladder may get the signal to empty before it is full. Very sensitive muscles can also make your bladder squeeze too soon. What are the causes? Many things can cause an overactive bladder. Possible causes include:  Urinary tract infection.  Infection of nearby tissues, such as the prostate.  Prostate enlargement.  Being pregnant with twins or more (multiples).  Surgery on the uterus or urethra.  Bladder stones, inflammation, or tumors.  Drinking too much caffeine or alcohol.  Certain medicines, especially those that you take to help your body get rid of extra fluid (diuretics) by increasing  urine production.  Muscle or nerve weakness, especially from: ? A spinal cord injury. ? Stroke. ? Multiple sclerosis. ? Parkinson disease.  Diabetes. This can cause a high urine volume that fills the bladder so quickly that the normal urge to urinate is triggered very strongly.  Constipation. A buildup of too much stool can put pressure on your bladder.  What increases the risk? You may be at greater risk for overactive bladder if you:  Are an older adult.  Smoke.  Are going through menopause.  Have prostate problems.  Have a neurological disease, such as stroke, dementia, Parkinson disease, or multiple sclerosis (MS).  Eat or drink things that irritate the bladder. These include alcohol, spicy food, and caffeine.  Are overweight or obese.  What are the signs or symptoms? The signs and symptoms of an overactive bladder include:  Sudden, strong urges to urinate.  Leaking urine.  Urinating eight or more times per day.  Waking up to urinate two or more times per night.  How is this diagnosed? Your health care provider may suspect overactive bladder based on your symptoms. The health care provider will do a physical exam and take your medical history. Blood or urine tests may also be done. For example, you might need to have a bladder function test to check how well you can hold your urine. You might also need to see a health care provider who specializes in the urinary tract (urologist). How is this treated? Treatment for overactive bladder depends on the cause of your condition and whether it is mild or severe. Certain treatments can be done in your health care provider's office or clinic. You can also make lifestyle changes at home. Options include: Behavioral Treatments  Biofeedback. A specialist uses sensors to help you become aware of your body's signals.  Keeping a daily log of when you need to urinate and what happens after the urge. This may help you manage your  condition.  Bladder training. This helps you learn to control the urge to urinate by following a schedule that directs you to urinate at regular intervals (timed voiding). At first, you might have to wait a few minutes after feeling the urge. In time, you should be able to schedule bathroom visits an hour or more apart.  Kegel exercises. These are exercises to strengthen the pelvic floor muscles, which support the bladder. Toning these muscles can help you control urination, even if your bladder muscles are overactive. A specialist will teach you how to do these exercises correctly. They require daily practice.  Weight loss. If you are obese or overweight, losing weight might relieve your symptoms of overactive bladder. Talk to your health care provider about losing weight and whether there is a specific program or method that would work best for you.  Diet change. This might help if constipation is making your overactive bladder worse. Your health care provider or a dietitian can explain ways to change what you eat to ease constipation. You might also need to consume less alcohol and caffeine or drink other fluids at different times of the day.  Stopping smoking.  Wearing pads to absorb leakage while you wait for other treatments to take effect. Physical Treatments  Electrical stimulation. Electrodes send gentle pulses of electricity to strengthen the nerves or muscles that help to control the bladder. Sometimes, the electrodes are placed outside of the body. In other cases, they might be placed inside the body (implanted). This treatment can take several months to have an effect.  Supportive devices. Women may need a plastic device that fits into the vagina and supports the bladder (pessary). Medicines Several medicines can help treat overactive bladder and are usually used along with other treatments. Some are injected into the muscles involved in urination. Others come in pill form. Your  health care provider may prescribe:  Antispasmodics. These medicines block the signals that the nerves send to the bladder. This keeps the bladder from releasing urine at the wrong time.  Tricyclic antidepressants. These types of antidepressants also relax bladder muscles.  Surgery  You may have a device implanted to help manage the nerve signals that indicate when you need to urinate.  You may have surgery to implant electrodes for electrical stimulation.  Sometimes, very severe cases of overactive bladder require surgery to change the shape of the bladder. Follow these instructions at home:  Take medicines only as directed by your health care provider.  Use any implants or a pessary as directed by your health care  provider.  Make any diet or lifestyle changes that are recommended by your health care provider. These might include: ? Drinking less fluid or drinking at different times of the day. If you need to urinate often during the night, you may need to stop drinking fluids early in the evening. ? Cutting down on caffeine or alcohol. Both can make an overactive bladder worse. Caffeine is found in coffee, tea, and sodas. ? Doing Kegel exercises to strengthen muscles. ? Losing weight if you need to. ? Eating a healthy and balanced diet to prevent constipation.  Keep a journal or log to track how much and when you drink and also when you feel the need to urinate. This will help your health care provider to monitor your condition. Contact a health care provider if:  Your symptoms do not get better after treatment.  Your pain and discomfort are getting worse.  You have more frequent urges to urinate.  You have a fever. Get help right away if: You are not able to control your bladder at all. This information is not intended to replace advice given to you by your health care provider. Make sure you discuss any questions you have with your health care provider. Document Released:  02/08/2009 Document Revised: 09/20/2015 Document Reviewed: 09/07/2013 Elsevier Interactive Patient Education  [employer].

## 2017-11-19 DIAGNOSIS — N3281 Overactive bladder: Secondary | ICD-10-CM | POA: Insufficient documentation

## 2017-11-19 DIAGNOSIS — F439 Reaction to severe stress, unspecified: Secondary | ICD-10-CM | POA: Insufficient documentation

## 2017-11-20 LAB — CYTOLOGY - PAP
BACTERIAL VAGINITIS: NEGATIVE
Candida vaginitis: NEGATIVE
DIAGNOSIS: NEGATIVE
HPV: NOT DETECTED
Trichomonas: NEGATIVE

## 2017-12-17 ENCOUNTER — Ambulatory Visit
Admission: RE | Admit: 2017-12-17 | Discharge: 2017-12-17 | Disposition: A | Discharge status: Home / Self Care | Payer: BLUE CROSS/BLUE SHIELD | Source: Ambulatory Visit | Attending: Vascular Surgery | Admitting: Vascular Surgery

## 2017-12-17 DIAGNOSIS — Z9889 Other specified postprocedural states: Secondary | ICD-10-CM

## 2017-12-29 ENCOUNTER — Telehealth: Payer: Self-pay | Admitting: Obstetrics and Gynecology

## 2017-12-29 NOTE — Telephone Encounter (Signed)
Spoke with patient. Does not wish to discuss alternative medications at this time. Advised ok to cancel OV, return call if desires to discuss alternatives or with any new symptoms. Patient verbalizes understanding.  Routing to provider for final review. Patient is agreeable to disposition. Will close encounter.

## 2017-12-29 NOTE — Telephone Encounter (Signed)
Patient calling to cancel 6 week recheck scheduled for tomorrow 12/30/17. States she took medication for overactive bladder for a week and a half before stopping due to dry mouth and other side effects. She is not interested in trying another medication. Unsure whether Dr. Quincy Simmonds would like her to still be seen or not.

## 2017-12-30 ENCOUNTER — Ambulatory Visit: Payer: BLUE CROSS/BLUE SHIELD | Admitting: Obstetrics and Gynecology

## 2018-12-03 ENCOUNTER — Ambulatory Visit: Payer: BLUE CROSS/BLUE SHIELD | Admitting: Obstetrics and Gynecology

## 2019-01-07 DIAGNOSIS — C50111 Malignant neoplasm of central portion of right female breast: Secondary | ICD-10-CM | POA: Diagnosis not present

## 2019-01-07 DIAGNOSIS — Z853 Personal history of malignant neoplasm of breast: Secondary | ICD-10-CM | POA: Diagnosis not present

## 2019-01-07 DIAGNOSIS — M899 Disorder of bone, unspecified: Secondary | ICD-10-CM | POA: Diagnosis not present

## 2019-01-07 DIAGNOSIS — Z170 Estrogen receptor positive status [ER+]: Secondary | ICD-10-CM | POA: Diagnosis not present

## 2019-01-18 DIAGNOSIS — Z6833 Body mass index (BMI) 33.0-33.9, adult: Secondary | ICD-10-CM | POA: Diagnosis not present

## 2019-01-18 DIAGNOSIS — E041 Nontoxic single thyroid nodule: Secondary | ICD-10-CM | POA: Diagnosis not present

## 2019-01-18 DIAGNOSIS — H43811 Vitreous degeneration, right eye: Secondary | ICD-10-CM | POA: Diagnosis not present

## 2019-01-18 DIAGNOSIS — H6983 Other specified disorders of Eustachian tube, bilateral: Secondary | ICD-10-CM | POA: Diagnosis not present

## 2019-01-25 ENCOUNTER — Other Ambulatory Visit: Payer: Self-pay | Admitting: Oncology

## 2019-01-25 DIAGNOSIS — Z1231 Encounter for screening mammogram for malignant neoplasm of breast: Secondary | ICD-10-CM

## 2019-02-08 DIAGNOSIS — E041 Nontoxic single thyroid nodule: Secondary | ICD-10-CM | POA: Diagnosis not present

## 2019-02-08 DIAGNOSIS — E042 Nontoxic multinodular goiter: Secondary | ICD-10-CM | POA: Diagnosis not present

## 2019-02-15 DIAGNOSIS — Z0000 Encounter for general adult medical examination without abnormal findings: Secondary | ICD-10-CM | POA: Diagnosis not present

## 2019-02-17 DIAGNOSIS — Z79899 Other long term (current) drug therapy: Secondary | ICD-10-CM | POA: Diagnosis not present

## 2019-02-17 DIAGNOSIS — Z23 Encounter for immunization: Secondary | ICD-10-CM | POA: Diagnosis not present

## 2019-02-17 DIAGNOSIS — E039 Hypothyroidism, unspecified: Secondary | ICD-10-CM | POA: Diagnosis not present

## 2019-02-17 DIAGNOSIS — E785 Hyperlipidemia, unspecified: Secondary | ICD-10-CM | POA: Diagnosis not present

## 2019-03-31 DIAGNOSIS — H43811 Vitreous degeneration, right eye: Secondary | ICD-10-CM | POA: Diagnosis not present

## 2019-03-31 DIAGNOSIS — H5203 Hypermetropia, bilateral: Secondary | ICD-10-CM | POA: Diagnosis not present

## 2019-04-01 ENCOUNTER — Other Ambulatory Visit: Payer: Self-pay

## 2019-04-01 ENCOUNTER — Ambulatory Visit
Admission: RE | Admit: 2019-04-01 | Discharge: 2019-04-01 | Disposition: A | Discharge status: Home / Self Care | Payer: Medicare HMO | Source: Ambulatory Visit | Attending: Oncology | Admitting: Oncology

## 2019-04-01 DIAGNOSIS — Z1231 Encounter for screening mammogram for malignant neoplasm of breast: Secondary | ICD-10-CM | POA: Diagnosis not present

## 2019-08-16 DIAGNOSIS — Z6832 Body mass index (BMI) 32.0-32.9, adult: Secondary | ICD-10-CM | POA: Diagnosis not present

## 2019-08-16 DIAGNOSIS — R42 Dizziness and giddiness: Secondary | ICD-10-CM | POA: Diagnosis not present

## 2019-08-16 DIAGNOSIS — R300 Dysuria: Secondary | ICD-10-CM | POA: Diagnosis not present

## 2019-08-17 ENCOUNTER — Encounter: Payer: Self-pay | Admitting: Neurology

## 2019-08-19 DIAGNOSIS — R42 Dizziness and giddiness: Secondary | ICD-10-CM | POA: Diagnosis not present

## 2019-08-19 DIAGNOSIS — G932 Benign intracranial hypertension: Secondary | ICD-10-CM | POA: Diagnosis not present

## 2019-08-30 DIAGNOSIS — H9202 Otalgia, left ear: Secondary | ICD-10-CM | POA: Diagnosis not present

## 2019-08-30 DIAGNOSIS — H903 Sensorineural hearing loss, bilateral: Secondary | ICD-10-CM | POA: Diagnosis not present

## 2019-09-16 DIAGNOSIS — S99922A Unspecified injury of left foot, initial encounter: Secondary | ICD-10-CM | POA: Diagnosis not present

## 2019-10-25 NOTE — Progress Notes (Signed)
NEUROLOGY CONSULTATION NOTE  Kelsey Meadows MRN: [id number] DOB: 02-Jun-1955  Referring provider: Kennith Maes, MD Primary care provider: Kennith Maes, MD  Reason for consult:  dizziness  HISTORY OF PRESENT ILLNESS: Kelsey Meadows is a 64 year old female with hypothyroidism and history of breast cancer who presents for dizziness.  History supplemented by referring provider's note.  For several years, she sometimes noticed that she may feel herself pulling to one side while walking down the hall.  Over the past year, it has progressively gotten more frequent.  She frequently feels unsteady on her feet and often needs to hold onto the wall or furniture when walking.  She feels dizzy, described as her head feeling clouded.  Not a headache or head pressure.  Minimal spinning sensation if any.  Sometimes feels lightheaded.  She feels that her vision is blurred but no double vision.  Last eye exam in December was unremarkable but she showed signs of early glaucoma. She feels fine when sitting or laying in bed.  She has history of intermittent brief nausea.  When focusing, she tends to look at objects using primarily one eye or the other.  She has sensation of fluid moving in her ears as well as ear pain.  She has been diagnosed with bilateral TMJ dysfunction.  She has trouble with coordination.  For example, she needs to get close to the screen in order to push buttons on a touch screen.   She thought she may have fluid in the ear.  She tried over the counter antihistamines which were ineffective.  She started using nasal steroids which decreased nasal congestion but did not improve her dizziness.  She notes some decreased hearing over the past year.  She reports that she had a head CT which was unremarkable.  She saw ENT at [facility].  Audiometric testing showed mild high-frequency sensorineural hearing loss.  No tinnitus.  She denies history of headaches or migraines.    PAST MEDICAL HISTORY: Past  Medical History:  Diagnosis Date  . Cancer [facility]) 2014   Breast Cancer  . Hyperlipidemia   . Hypothyroid    has thyroid nodule follow by [facility] yearly  . Osteopenia 2017   T score spine - 2.3, right femoral neck -1.6, total right hip - 0.9.  Marland Kitchen Personal history of radiation therapy 2014  . Reflux     PAST SURGICAL HISTORY: Past Surgical History:  Procedure Laterality Date  . BREAST LUMPECTOMY Right 09/29/2012  . BREAST SURGERY  09/2012   Due to Breast Cancer  . hyperlipid    . TUBAL LIGATION  1982  . WRIST SURGERY  05/2013    MEDICATIONS: Current Outpatient Medications on File Prior to Visit  Medication Sig Dispense Refill  . B Complex Vitamins (B COMPLEX-B12 PO) Take 1 capsule by mouth daily.    Marland Kitchen Bioflavonoid Products (BIOFLEX) TABS Take by mouth daily.    . calcitonin, salmon, (MIACALCIN/FORTICAL) 200 UNIT/ACT nasal spray Place 1 spray into alternate nostrils daily.    . Calcium Carb-Cholecalciferol (CALCIUM 1000 + D PO) Take by mouth daily.    . DiphenhydrAMINE HCl (BENADRYL ALLERGY PO) Take by mouth as needed.    . meloxicam (MOBIC) 15 MG tablet Take 15 mg by mouth daily.    . Multiple Vitamin (MULTIVITAMIN) tablet Take 1 tablet by mouth daily.    Marland Kitchen oxybutynin (DITROPAN XL) 10 MG 24 hr tablet Take 1 tablet (10 mg total) by mouth at bedtime. 30 tablet 1  .  PRILOSEC OTC 20 MG tablet Take 20 mg by mouth daily.     . Probiotic Product (PROBIOTIC PO) Take 2 capsules by mouth.    . rosuvastatin (CRESTOR) 10 MG tablet Take 10 mg by mouth daily.    . sertraline (ZOLOFT) 50 MG tablet Take 50 mg by mouth daily.     No current facility-administered medications on file prior to visit.    ALLERGIES: No Known Allergies  FAMILY HISTORY: Family History  Problem Relation Age of Onset  . Depression Mother   . Osteoporosis Mother   . Stroke Father     SOCIAL HISTORY: Social History   Socioeconomic History  . Marital status: Married    Spouse name: Not on file  . Number of  children: Not on file  . Years of education: Not on file  . Highest education level: Not on file  Occupational History  . Not on file  Tobacco Use  . Smoking status: Never Smoker  . Smokeless tobacco: Never Used  Vaping Use  . Vaping Use: Never used  Substance and Sexual Activity  . Alcohol use: No    Alcohol/week: 0.0 standard drinks  . Drug use: No  . Sexual activity: Not Currently    Partners: Male    Birth control/protection: Surgical    Comment: tubal  Other Topics Concern  . Not on file  Social History Narrative  . Not on file   Social Determinants of Health   Financial Resource Strain:   . Difficulty of Paying Living Expenses:   Food Insecurity:   . Worried About Charity fundraiser in the Last Year:   . Arboriculturist in the Last Year:   Transportation Needs:   . Film/video editor (Medical):   Marland Kitchen Lack of Transportation (Non-Medical):   Physical Activity:   . Days of Exercise per Week:   . Minutes of Exercise per Session:   Stress:   . Feeling of Stress :   Social Connections:   . Frequency of Communication with Friends and Family:   . Frequency of Social Gatherings with Friends and Family:   . Attends Religious Services:   . Active Member of Clubs or Organizations:   . Attends Archivist Meetings:   Marland Kitchen Marital Status:   Intimate Partner Violence:   . Fear of Current or Ex-Partner:   . Emotionally Abused:   Marland Kitchen Physically Abused:   . Sexually Abused:     PHYSICAL EXAM: Blood pressure 138/76, pulse 76, height 5\' 1"  (1.549 m), weight 172 lb 12.8 oz (78.4 kg), last menstrual period 04/29/2007, SpO2 94 %. General: No acute distress.  Patient appears well-groomed.   Head:  Normocephalic/atraumatic Eyes:  fundi examined but not visualized Neck: supple, no paraspinal tenderness, full range of motion Back: No paraspinal tenderness Heart: regular rate and rhythm Lungs: Clear to auscultation bilaterally. Vascular: No carotid bruits. Neurological  Exam: Mental status: alert and oriented to person, place, and time, recent and remote memory intact, fund of knowledge intact, attention and concentration intact, speech fluent and not dysarthric, language intact. Cranial nerves: CN I: not tested CN II: pupils equal, round and reactive to light, visual fields intact CN III, IV, VI:  full range of motion with some saccadic eye movements with tracking, no nystagmus, no ptosis CN V: facial sensation intact CN VII: upper and lower face symmetric CN VIII: hearing intact CN IX, X: gag intact, uvula midline CN XI: sternocleidomastoid and trapezius muscles intact CN XII:  tongue midline Bulk & Tone: normal, no fasciculations. Motor:  5/5 throughout  Sensation:  Pinprick and vibration sensation intact. Deep Tendon Reflexes:  2+ throughout, toes downgoing.   Finger to nose testing:  Without dysmetria.   Heel to shin:  Without dysmetria.   Gait:  Mildly ataxic.  Able to turn.  Some difficulty with tandem walk.  Romberg with mild sway.  IMPRESSION: Disequilibrium Ataxia  PLAN: 1.  I will order MRI of brain with and without contrast to evaluate for intracranial etiology for disequilibrium/vestibulopathy. 2.  Further recommendations pending results.  If MRI unrevealing, may need to refer to a vestibular clinic for further evaluation.  Thank you for allowing me to take part in the care of this patient.  Metta Clines, DO  CC: Kennith Maes, MD

## 2019-10-27 ENCOUNTER — Encounter: Payer: Self-pay | Admitting: Neurology

## 2019-10-27 ENCOUNTER — Other Ambulatory Visit: Payer: Self-pay

## 2019-10-27 ENCOUNTER — Ambulatory Visit: Payer: Medicare HMO | Admitting: Neurology

## 2019-10-27 VITALS — BP 138/76 | HR 76 | Ht 61.0 in | Wt 172.8 lb

## 2019-10-27 DIAGNOSIS — H539 Unspecified visual disturbance: Secondary | ICD-10-CM

## 2019-10-27 DIAGNOSIS — R270 Ataxia, unspecified: Secondary | ICD-10-CM | POA: Diagnosis not present

## 2019-10-27 DIAGNOSIS — R42 Dizziness and giddiness: Secondary | ICD-10-CM | POA: Diagnosis not present

## 2019-10-27 NOTE — Patient Instructions (Addendum)
We will check an MRI of the brain with and without contrast. We have sent a referral to [facility] for your MRI and they will call you directly to schedule your appointment. They are located at [place]. If you need to contact them directly please call [phone].  If unremarkable, I will refer you to a vestibular clinic

## 2019-10-28 DIAGNOSIS — S99922A Unspecified injury of left foot, initial encounter: Secondary | ICD-10-CM | POA: Diagnosis not present

## 2019-11-03 ENCOUNTER — Telehealth: Payer: Self-pay

## 2019-11-03 NOTE — Telephone Encounter (Signed)
Eft VM requesting pt return call to discuss test results.

## 2019-11-04 ENCOUNTER — Telehealth: Payer: Self-pay

## 2019-11-04 NOTE — Telephone Encounter (Signed)
Left VM with following test results per Dr Tomi Likens -   MRI of brain unremarkable. It shows mild age-related changes but nothing that would be causing disequilibrium. I would like to refer her to the vestibular clinic at [facility] for further evaluation.   Requested pt return call for how she would like to proceed.

## 2019-11-09 ENCOUNTER — Telehealth: Payer: Self-pay

## 2019-11-09 NOTE — Telephone Encounter (Signed)
Called listed contact # [phone] and was told by her husband that she has moved to [facility], said she could be reached @ [phone], no answer, left msg to return call to [facility].

## 2019-11-14 DIAGNOSIS — S99922A Unspecified injury of left foot, initial encounter: Secondary | ICD-10-CM | POA: Diagnosis not present

## 2019-11-14 DIAGNOSIS — M19072 Primary osteoarthritis, left ankle and foot: Secondary | ICD-10-CM | POA: Diagnosis not present

## 2019-11-14 DIAGNOSIS — W208XXA Other cause of strike by thrown, projected or falling object, initial encounter: Secondary | ICD-10-CM | POA: Diagnosis not present

## 2019-11-14 DIAGNOSIS — M7732 Calcaneal spur, left foot: Secondary | ICD-10-CM | POA: Diagnosis not present

## 2019-11-14 DIAGNOSIS — M2012 Hallux valgus (acquired), left foot: Secondary | ICD-10-CM | POA: Diagnosis not present

## 2019-11-16 DIAGNOSIS — R300 Dysuria: Secondary | ICD-10-CM | POA: Diagnosis not present

## 2019-11-16 DIAGNOSIS — Z6831 Body mass index (BMI) 31.0-31.9, adult: Secondary | ICD-10-CM | POA: Diagnosis not present

## 2019-11-22 DIAGNOSIS — S99922A Unspecified injury of left foot, initial encounter: Secondary | ICD-10-CM | POA: Diagnosis not present

## 2019-11-22 DIAGNOSIS — M19072 Primary osteoarthritis, left ankle and foot: Secondary | ICD-10-CM | POA: Diagnosis not present

## 2019-11-30 ENCOUNTER — Ambulatory Visit
Admission: RE | Admit: 2019-11-30 | Discharge: 2019-11-30 | Disposition: A | Discharge status: Home / Self Care | Payer: Medicare HMO | Source: Ambulatory Visit | Attending: Neurology | Admitting: Neurology

## 2019-11-30 ENCOUNTER — Other Ambulatory Visit: Payer: Self-pay

## 2019-11-30 DIAGNOSIS — H539 Unspecified visual disturbance: Secondary | ICD-10-CM

## 2019-11-30 DIAGNOSIS — R42 Dizziness and giddiness: Secondary | ICD-10-CM

## 2019-11-30 DIAGNOSIS — R9082 White matter disease, unspecified: Secondary | ICD-10-CM | POA: Diagnosis not present

## 2019-11-30 DIAGNOSIS — R270 Ataxia, unspecified: Secondary | ICD-10-CM

## 2019-11-30 MED ORDER — GADOBENATE DIMEGLUMINE 529 MG/ML IV SOLN
16.0000 mL | Freq: Once | INTRAVENOUS | Status: AC | PRN
  Administered 2019-11-30: 16 mL via INTRAVENOUS

## 2019-12-01 ENCOUNTER — Telehealth: Payer: Self-pay

## 2019-12-01 NOTE — Telephone Encounter (Signed)
-----   Message from Cameron Sprang, MD sent at 12/01/2019  9:38 AM EDT ----- Pls let her know MRI brain looked fine, no evidence of tumor, stroke, or bleed. No change from her prior scan. Thanks

## 2019-12-01 NOTE — Telephone Encounter (Signed)
Brain MRI results given to pt.

## 2019-12-13 ENCOUNTER — Telehealth: Payer: Self-pay

## 2019-12-13 NOTE — Telephone Encounter (Signed)
-----   Message from Pieter Partridge, DO sent at 12/08/2019  8:09 AM EDT ----- If she is still experiencing dizziness, would refer to vestibular clinic at [facility]

## 2019-12-13 NOTE — Telephone Encounter (Signed)
Telephone call to pt to see how she is doing.

## 2020-04-20 DIAGNOSIS — Z20828 Contact with and (suspected) exposure to other viral communicable diseases: Secondary | ICD-10-CM | POA: Diagnosis not present

## 2020-04-20 DIAGNOSIS — J019 Acute sinusitis, unspecified: Secondary | ICD-10-CM | POA: Diagnosis not present

## 2020-05-04 DIAGNOSIS — M25552 Pain in left hip: Secondary | ICD-10-CM | POA: Diagnosis not present

## 2020-05-04 DIAGNOSIS — Z6831 Body mass index (BMI) 31.0-31.9, adult: Secondary | ICD-10-CM | POA: Diagnosis not present

## 2020-05-08 DIAGNOSIS — M7060 Trochanteric bursitis, unspecified hip: Secondary | ICD-10-CM | POA: Diagnosis not present

## 2020-05-11 DIAGNOSIS — M8580 Other specified disorders of bone density and structure, unspecified site: Secondary | ICD-10-CM | POA: Diagnosis not present

## 2020-05-11 DIAGNOSIS — Z79899 Other long term (current) drug therapy: Secondary | ICD-10-CM | POA: Diagnosis not present

## 2020-05-11 DIAGNOSIS — R69 Illness, unspecified: Secondary | ICD-10-CM | POA: Diagnosis not present

## 2020-05-11 DIAGNOSIS — M9902 Segmental and somatic dysfunction of thoracic region: Secondary | ICD-10-CM | POA: Diagnosis not present

## 2020-05-11 DIAGNOSIS — M9905 Segmental and somatic dysfunction of pelvic region: Secondary | ICD-10-CM | POA: Diagnosis not present

## 2020-05-11 DIAGNOSIS — E039 Hypothyroidism, unspecified: Secondary | ICD-10-CM | POA: Diagnosis not present

## 2020-05-11 DIAGNOSIS — E785 Hyperlipidemia, unspecified: Secondary | ICD-10-CM | POA: Diagnosis not present

## 2020-05-11 DIAGNOSIS — Z1331 Encounter for screening for depression: Secondary | ICD-10-CM | POA: Diagnosis not present

## 2020-05-11 DIAGNOSIS — Z0000 Encounter for general adult medical examination without abnormal findings: Secondary | ICD-10-CM | POA: Diagnosis not present

## 2020-05-11 DIAGNOSIS — M9904 Segmental and somatic dysfunction of sacral region: Secondary | ICD-10-CM | POA: Diagnosis not present

## 2020-05-11 DIAGNOSIS — M9903 Segmental and somatic dysfunction of lumbar region: Secondary | ICD-10-CM | POA: Diagnosis not present

## 2020-05-11 DIAGNOSIS — Z6831 Body mass index (BMI) 31.0-31.9, adult: Secondary | ICD-10-CM | POA: Diagnosis not present

## 2020-05-11 DIAGNOSIS — R300 Dysuria: Secondary | ICD-10-CM | POA: Diagnosis not present

## 2020-05-21 DIAGNOSIS — M9904 Segmental and somatic dysfunction of sacral region: Secondary | ICD-10-CM | POA: Diagnosis not present

## 2020-05-21 DIAGNOSIS — M9903 Segmental and somatic dysfunction of lumbar region: Secondary | ICD-10-CM | POA: Diagnosis not present

## 2020-05-21 DIAGNOSIS — M9902 Segmental and somatic dysfunction of thoracic region: Secondary | ICD-10-CM | POA: Diagnosis not present

## 2020-05-21 DIAGNOSIS — M9905 Segmental and somatic dysfunction of pelvic region: Secondary | ICD-10-CM | POA: Diagnosis not present

## 2020-05-23 DIAGNOSIS — J310 Chronic rhinitis: Secondary | ICD-10-CM | POA: Diagnosis not present

## 2020-05-23 DIAGNOSIS — J329 Chronic sinusitis, unspecified: Secondary | ICD-10-CM | POA: Diagnosis not present

## 2020-05-23 DIAGNOSIS — Z20828 Contact with and (suspected) exposure to other viral communicable diseases: Secondary | ICD-10-CM | POA: Diagnosis not present

## 2020-05-28 DIAGNOSIS — M9903 Segmental and somatic dysfunction of lumbar region: Secondary | ICD-10-CM | POA: Diagnosis not present

## 2020-05-28 DIAGNOSIS — M9904 Segmental and somatic dysfunction of sacral region: Secondary | ICD-10-CM | POA: Diagnosis not present

## 2020-05-28 DIAGNOSIS — M9902 Segmental and somatic dysfunction of thoracic region: Secondary | ICD-10-CM | POA: Diagnosis not present

## 2020-05-28 DIAGNOSIS — M9905 Segmental and somatic dysfunction of pelvic region: Secondary | ICD-10-CM | POA: Diagnosis not present

## 2020-06-06 DIAGNOSIS — M9903 Segmental and somatic dysfunction of lumbar region: Secondary | ICD-10-CM | POA: Diagnosis not present

## 2020-06-06 DIAGNOSIS — M9904 Segmental and somatic dysfunction of sacral region: Secondary | ICD-10-CM | POA: Diagnosis not present

## 2020-06-06 DIAGNOSIS — M9905 Segmental and somatic dysfunction of pelvic region: Secondary | ICD-10-CM | POA: Diagnosis not present

## 2020-06-06 DIAGNOSIS — M9902 Segmental and somatic dysfunction of thoracic region: Secondary | ICD-10-CM | POA: Diagnosis not present

## 2020-06-11 DIAGNOSIS — M9904 Segmental and somatic dysfunction of sacral region: Secondary | ICD-10-CM | POA: Diagnosis not present

## 2020-06-11 DIAGNOSIS — M9902 Segmental and somatic dysfunction of thoracic region: Secondary | ICD-10-CM | POA: Diagnosis not present

## 2020-06-11 DIAGNOSIS — M9903 Segmental and somatic dysfunction of lumbar region: Secondary | ICD-10-CM | POA: Diagnosis not present

## 2020-06-11 DIAGNOSIS — M9905 Segmental and somatic dysfunction of pelvic region: Secondary | ICD-10-CM | POA: Diagnosis not present

## 2020-06-25 DIAGNOSIS — M9902 Segmental and somatic dysfunction of thoracic region: Secondary | ICD-10-CM | POA: Diagnosis not present

## 2020-06-25 DIAGNOSIS — M9904 Segmental and somatic dysfunction of sacral region: Secondary | ICD-10-CM | POA: Diagnosis not present

## 2020-06-25 DIAGNOSIS — M9905 Segmental and somatic dysfunction of pelvic region: Secondary | ICD-10-CM | POA: Diagnosis not present

## 2020-06-25 DIAGNOSIS — M9903 Segmental and somatic dysfunction of lumbar region: Secondary | ICD-10-CM | POA: Diagnosis not present

## 2020-07-11 DIAGNOSIS — M9904 Segmental and somatic dysfunction of sacral region: Secondary | ICD-10-CM | POA: Diagnosis not present

## 2020-07-11 DIAGNOSIS — M9905 Segmental and somatic dysfunction of pelvic region: Secondary | ICD-10-CM | POA: Diagnosis not present

## 2020-07-11 DIAGNOSIS — M9902 Segmental and somatic dysfunction of thoracic region: Secondary | ICD-10-CM | POA: Diagnosis not present

## 2020-07-11 DIAGNOSIS — M9903 Segmental and somatic dysfunction of lumbar region: Secondary | ICD-10-CM | POA: Diagnosis not present

## 2020-08-08 DIAGNOSIS — M9905 Segmental and somatic dysfunction of pelvic region: Secondary | ICD-10-CM | POA: Diagnosis not present

## 2020-08-08 DIAGNOSIS — M9904 Segmental and somatic dysfunction of sacral region: Secondary | ICD-10-CM | POA: Diagnosis not present

## 2020-08-08 DIAGNOSIS — M9902 Segmental and somatic dysfunction of thoracic region: Secondary | ICD-10-CM | POA: Diagnosis not present

## 2020-08-08 DIAGNOSIS — M9903 Segmental and somatic dysfunction of lumbar region: Secondary | ICD-10-CM | POA: Diagnosis not present

## 2020-08-09 DIAGNOSIS — L814 Other melanin hyperpigmentation: Secondary | ICD-10-CM | POA: Diagnosis not present

## 2020-08-09 DIAGNOSIS — D225 Melanocytic nevi of trunk: Secondary | ICD-10-CM | POA: Diagnosis not present

## 2020-08-09 DIAGNOSIS — L821 Other seborrheic keratosis: Secondary | ICD-10-CM | POA: Diagnosis not present

## 2020-08-09 DIAGNOSIS — L708 Other acne: Secondary | ICD-10-CM | POA: Diagnosis not present

## 2020-08-09 DIAGNOSIS — D1801 Hemangioma of skin and subcutaneous tissue: Secondary | ICD-10-CM | POA: Diagnosis not present

## 2020-08-09 DIAGNOSIS — D485 Neoplasm of uncertain behavior of skin: Secondary | ICD-10-CM | POA: Diagnosis not present

## 2020-12-05 DIAGNOSIS — H43811 Vitreous degeneration, right eye: Secondary | ICD-10-CM | POA: Diagnosis not present

## 2020-12-05 DIAGNOSIS — H04123 Dry eye syndrome of bilateral lacrimal glands: Secondary | ICD-10-CM | POA: Diagnosis not present

## 2020-12-05 DIAGNOSIS — H5203 Hypermetropia, bilateral: Secondary | ICD-10-CM | POA: Diagnosis not present

## 2021-05-28 ENCOUNTER — Other Ambulatory Visit: Payer: Self-pay | Admitting: Family Medicine

## 2021-05-28 DIAGNOSIS — Z6831 Body mass index (BMI) 31.0-31.9, adult: Secondary | ICD-10-CM | POA: Diagnosis not present

## 2021-05-28 DIAGNOSIS — R69 Illness, unspecified: Secondary | ICD-10-CM | POA: Diagnosis not present

## 2021-05-28 DIAGNOSIS — Z1331 Encounter for screening for depression: Secondary | ICD-10-CM | POA: Diagnosis not present

## 2021-05-28 DIAGNOSIS — K130 Diseases of lips: Secondary | ICD-10-CM | POA: Diagnosis not present

## 2021-05-28 DIAGNOSIS — Z0000 Encounter for general adult medical examination without abnormal findings: Secondary | ICD-10-CM | POA: Diagnosis not present

## 2021-05-28 DIAGNOSIS — E7800 Pure hypercholesterolemia, unspecified: Secondary | ICD-10-CM | POA: Diagnosis not present

## 2021-05-28 DIAGNOSIS — Z23 Encounter for immunization: Secondary | ICD-10-CM | POA: Diagnosis not present

## 2021-05-28 DIAGNOSIS — Z1231 Encounter for screening mammogram for malignant neoplasm of breast: Secondary | ICD-10-CM

## 2021-05-28 DIAGNOSIS — E039 Hypothyroidism, unspecified: Secondary | ICD-10-CM | POA: Diagnosis not present

## 2021-05-28 DIAGNOSIS — Z79899 Other long term (current) drug therapy: Secondary | ICD-10-CM | POA: Diagnosis not present

## 2021-05-29 ENCOUNTER — Ambulatory Visit
Admission: RE | Admit: 2021-05-29 | Discharge: 2021-05-29 | Disposition: A | Discharge status: Home / Self Care | Payer: Medicare HMO | Source: Ambulatory Visit | Attending: Family Medicine | Admitting: Family Medicine

## 2021-05-29 DIAGNOSIS — Z1231 Encounter for screening mammogram for malignant neoplasm of breast: Secondary | ICD-10-CM

## 2021-05-30 DIAGNOSIS — E039 Hypothyroidism, unspecified: Secondary | ICD-10-CM | POA: Diagnosis not present

## 2021-05-30 DIAGNOSIS — E042 Nontoxic multinodular goiter: Secondary | ICD-10-CM | POA: Diagnosis not present

## 2021-06-14 DIAGNOSIS — K219 Gastro-esophageal reflux disease without esophagitis: Secondary | ICD-10-CM | POA: Diagnosis not present

## 2021-06-14 DIAGNOSIS — R079 Chest pain, unspecified: Secondary | ICD-10-CM | POA: Diagnosis not present

## 2021-07-03 DIAGNOSIS — Z6832 Body mass index (BMI) 32.0-32.9, adult: Secondary | ICD-10-CM | POA: Diagnosis not present

## 2021-07-03 DIAGNOSIS — K219 Gastro-esophageal reflux disease without esophagitis: Secondary | ICD-10-CM | POA: Diagnosis not present

## 2021-07-03 DIAGNOSIS — R69 Illness, unspecified: Secondary | ICD-10-CM | POA: Diagnosis not present

## 2021-08-12 DIAGNOSIS — L814 Other melanin hyperpigmentation: Secondary | ICD-10-CM | POA: Diagnosis not present

## 2021-08-12 DIAGNOSIS — D485 Neoplasm of uncertain behavior of skin: Secondary | ICD-10-CM | POA: Diagnosis not present

## 2021-08-12 DIAGNOSIS — L570 Actinic keratosis: Secondary | ICD-10-CM | POA: Diagnosis not present

## 2021-08-12 DIAGNOSIS — D225 Melanocytic nevi of trunk: Secondary | ICD-10-CM | POA: Diagnosis not present

## 2021-08-12 DIAGNOSIS — D2272 Melanocytic nevi of left lower limb, including hip: Secondary | ICD-10-CM | POA: Diagnosis not present

## 2021-08-12 DIAGNOSIS — L298 Other pruritus: Secondary | ICD-10-CM | POA: Diagnosis not present

## 2021-08-12 DIAGNOSIS — L821 Other seborrheic keratosis: Secondary | ICD-10-CM | POA: Diagnosis not present

## 2021-08-27 DIAGNOSIS — L570 Actinic keratosis: Secondary | ICD-10-CM | POA: Diagnosis not present

## 2021-08-27 DIAGNOSIS — B001 Herpesviral vesicular dermatitis: Secondary | ICD-10-CM | POA: Diagnosis not present

## 2021-10-15 DIAGNOSIS — L570 Actinic keratosis: Secondary | ICD-10-CM | POA: Diagnosis not present

## 2021-10-15 DIAGNOSIS — Z872 Personal history of diseases of the skin and subcutaneous tissue: Secondary | ICD-10-CM | POA: Diagnosis not present

## 2021-10-15 DIAGNOSIS — L905 Scar conditions and fibrosis of skin: Secondary | ICD-10-CM | POA: Diagnosis not present

## 2021-10-15 DIAGNOSIS — B001 Herpesviral vesicular dermatitis: Secondary | ICD-10-CM | POA: Diagnosis not present

## 2022-01-21 DIAGNOSIS — H5203 Hypermetropia, bilateral: Secondary | ICD-10-CM | POA: Diagnosis not present

## 2022-01-21 DIAGNOSIS — H04123 Dry eye syndrome of bilateral lacrimal glands: Secondary | ICD-10-CM | POA: Diagnosis not present

## 2022-01-21 DIAGNOSIS — H43811 Vitreous degeneration, right eye: Secondary | ICD-10-CM | POA: Diagnosis not present

## 2022-03-03 ENCOUNTER — Other Ambulatory Visit: Payer: Self-pay | Admitting: Family Medicine

## 2022-03-03 DIAGNOSIS — Z1231 Encounter for screening mammogram for malignant neoplasm of breast: Secondary | ICD-10-CM

## 2022-05-02 DIAGNOSIS — L814 Other melanin hyperpigmentation: Secondary | ICD-10-CM | POA: Diagnosis not present

## 2022-05-02 DIAGNOSIS — L578 Other skin changes due to chronic exposure to nonionizing radiation: Secondary | ICD-10-CM | POA: Diagnosis not present

## 2022-05-02 DIAGNOSIS — L821 Other seborrheic keratosis: Secondary | ICD-10-CM | POA: Diagnosis not present

## 2022-05-02 DIAGNOSIS — Z09 Encounter for follow-up examination after completed treatment for conditions other than malignant neoplasm: Secondary | ICD-10-CM | POA: Diagnosis not present

## 2022-05-02 DIAGNOSIS — L570 Actinic keratosis: Secondary | ICD-10-CM | POA: Diagnosis not present

## 2022-06-04 DIAGNOSIS — R69 Illness, unspecified: Secondary | ICD-10-CM | POA: Diagnosis not present

## 2022-06-04 DIAGNOSIS — I10 Essential (primary) hypertension: Secondary | ICD-10-CM | POA: Diagnosis not present

## 2022-06-04 DIAGNOSIS — K219 Gastro-esophageal reflux disease without esophagitis: Secondary | ICD-10-CM | POA: Diagnosis not present

## 2022-06-04 DIAGNOSIS — E039 Hypothyroidism, unspecified: Secondary | ICD-10-CM | POA: Diagnosis not present

## 2022-06-04 DIAGNOSIS — Z79899 Other long term (current) drug therapy: Secondary | ICD-10-CM | POA: Diagnosis not present

## 2022-06-04 DIAGNOSIS — E7800 Pure hypercholesterolemia, unspecified: Secondary | ICD-10-CM | POA: Diagnosis not present

## 2022-06-04 DIAGNOSIS — M8580 Other specified disorders of bone density and structure, unspecified site: Secondary | ICD-10-CM | POA: Diagnosis not present

## 2022-06-04 DIAGNOSIS — Z6832 Body mass index (BMI) 32.0-32.9, adult: Secondary | ICD-10-CM | POA: Diagnosis not present

## 2022-06-04 DIAGNOSIS — Z1331 Encounter for screening for depression: Secondary | ICD-10-CM | POA: Diagnosis not present

## 2022-06-04 DIAGNOSIS — Z0000 Encounter for general adult medical examination without abnormal findings: Secondary | ICD-10-CM | POA: Diagnosis not present

## 2022-06-05 ENCOUNTER — Ambulatory Visit
Admission: RE | Admit: 2022-06-05 | Discharge: 2022-06-05 | Disposition: A | Discharge status: Home / Self Care | Payer: Medicare HMO | Source: Ambulatory Visit | Attending: Family Medicine | Admitting: Family Medicine

## 2022-06-05 DIAGNOSIS — Z1231 Encounter for screening mammogram for malignant neoplasm of breast: Secondary | ICD-10-CM

## 2022-07-10 IMAGING — MG MM DIGITAL SCREENING BILAT W/ TOMO AND CAD
6 of 10 series · 6 of 30 positions shown · non-contrast
Comparison: Previous exam(s).

CLINICAL DATA: Screening.

EXAM:
DIGITAL SCREENING BILATERAL MAMMOGRAM WITH TOMOSYNTHESIS AND CAD
TECHNIQUE: Bilateral screening digital craniocaudal and mediolateral oblique
mammograms were obtained. Bilateral screening digital breast
tomosynthesis was performed. The images were evaluated with
computer-aided detection.

[L MLO synth-2D]
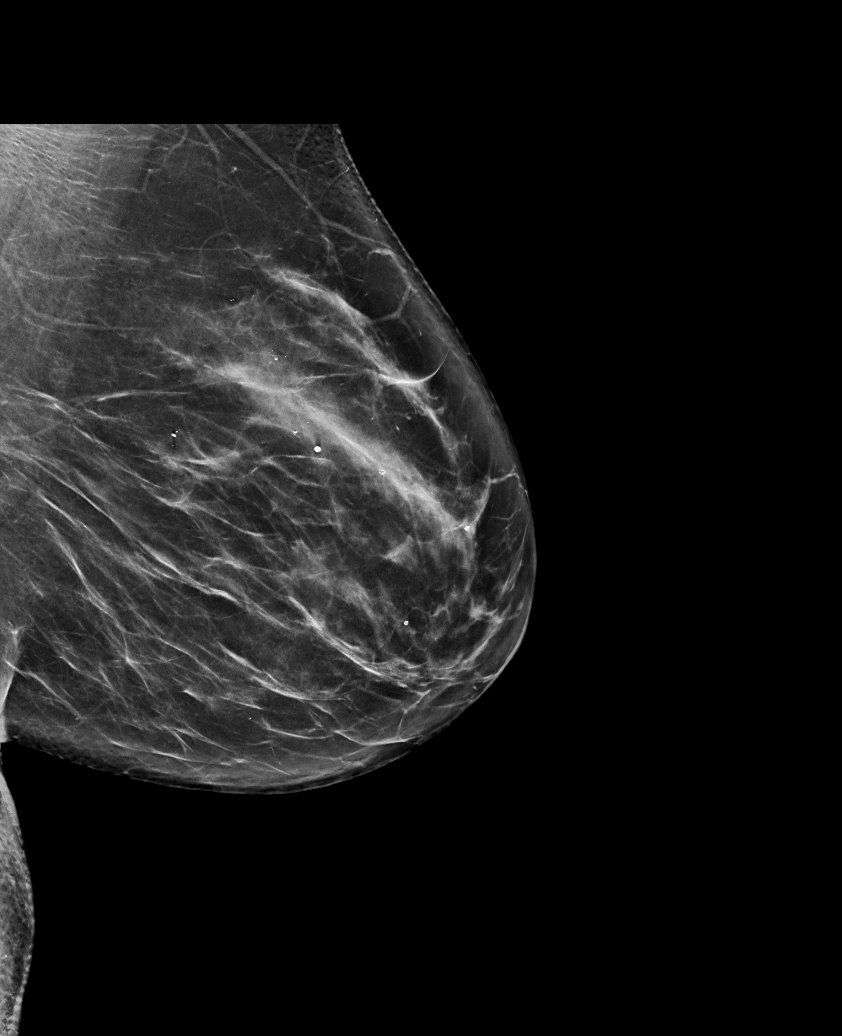

[R CC synth-2D (1 of 2)]
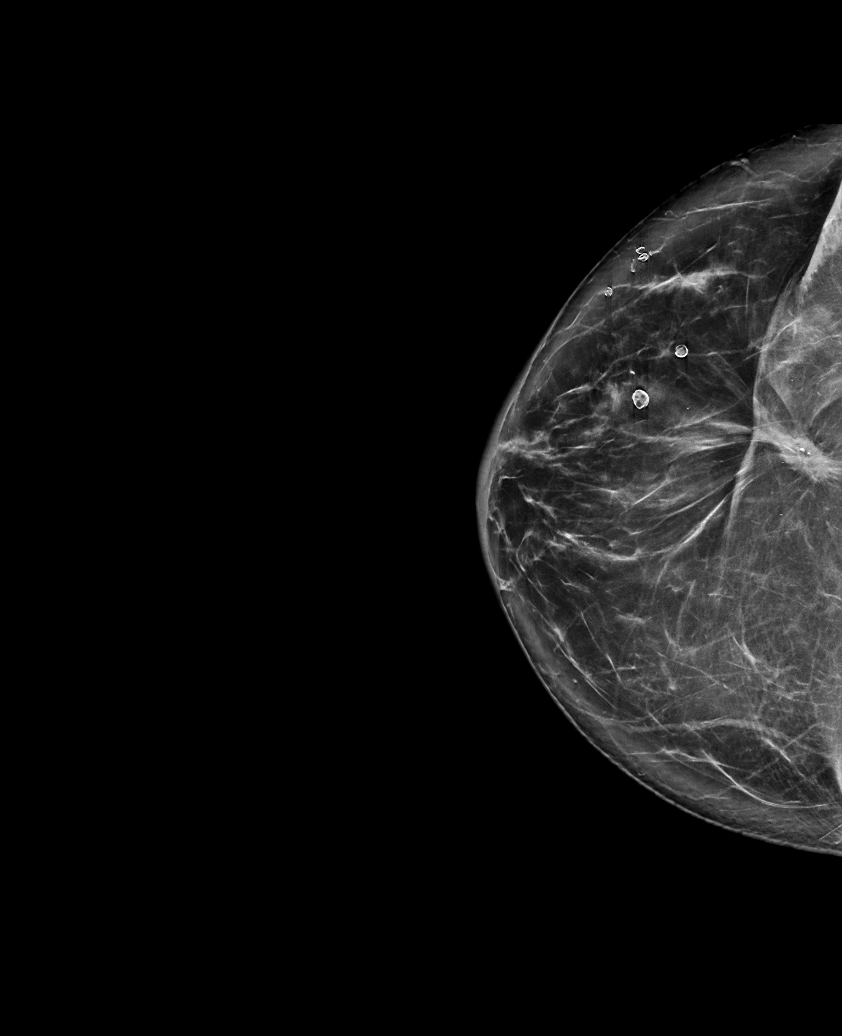

[R MLO synth-2D]
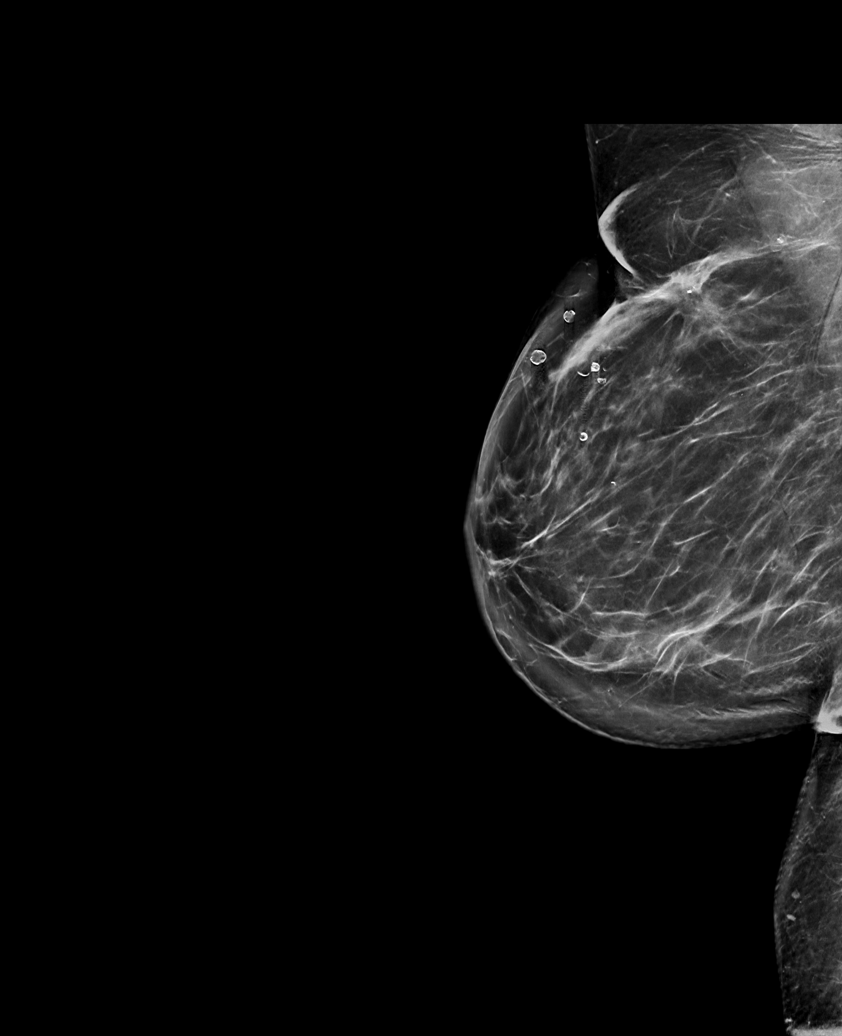

[L CC synth-2D]
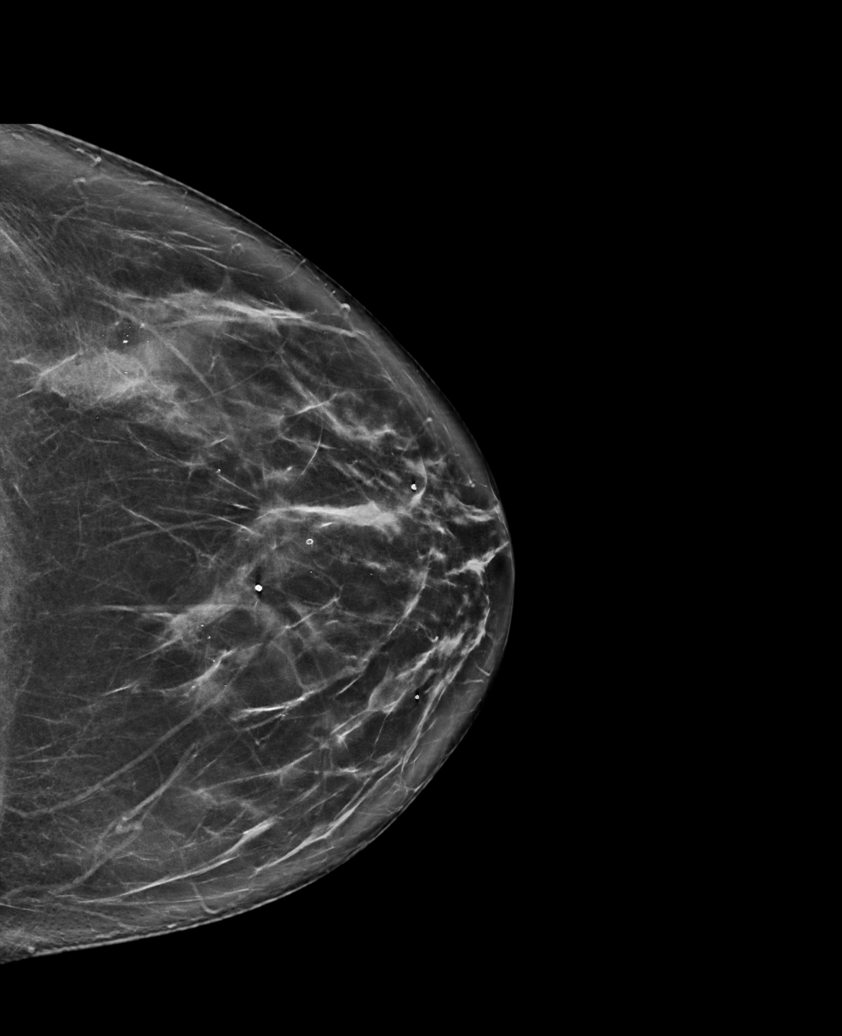

[R CC synth-2D (2 of 2)]
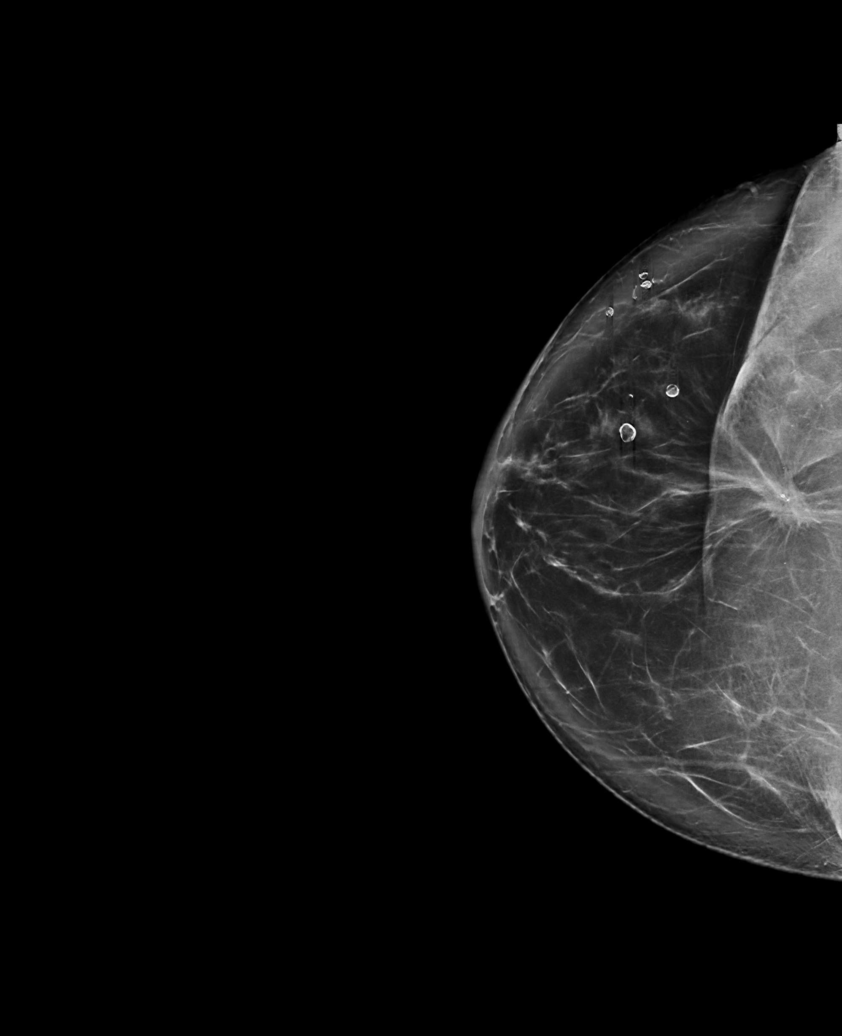

[R CC tomo · tomo slice 45/88.0]
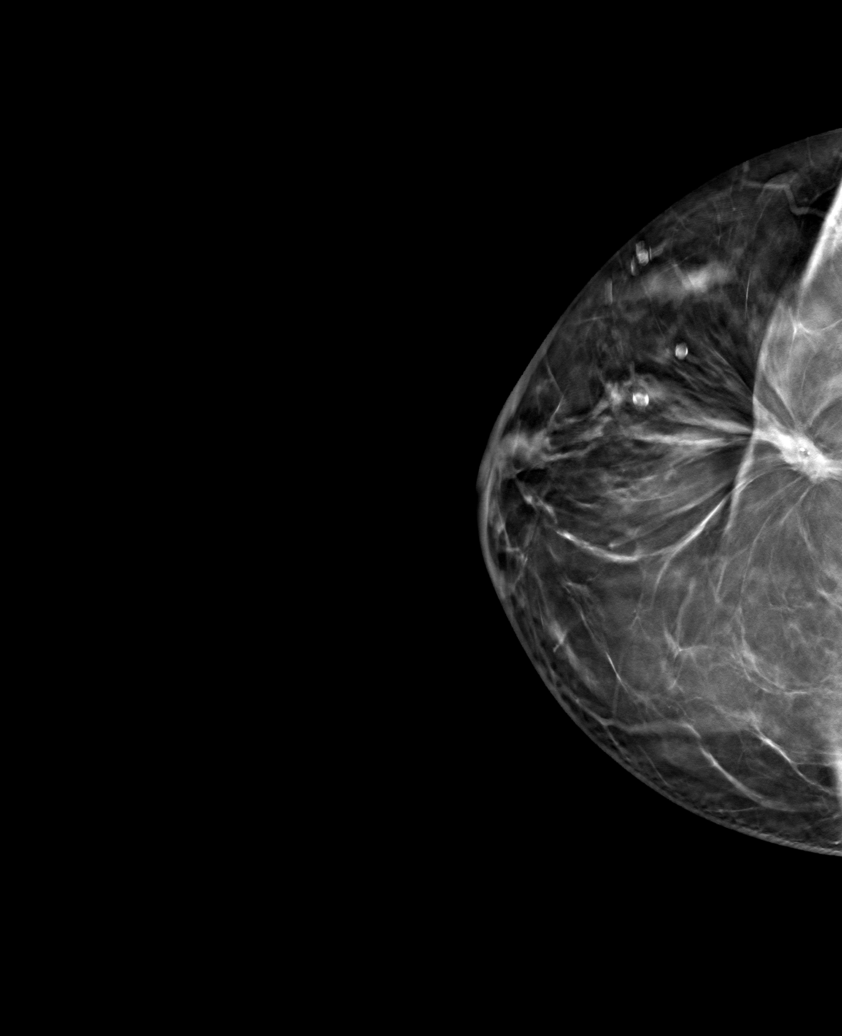

[6 of 30 positions shown; findings below may reference images not displayed]

ACR Breast Density Category b: There are scattered areas of
fibroglandular density.
FINDINGS: There are no findings suspicious for malignancy.
IMPRESSION: No mammographic evidence of malignancy. A result letter of this
screening mammogram will be mailed directly to the patient.

RECOMMENDATION:
Screening mammogram in one year. (Code:51-O-LD2)

BI-RADS CATEGORY  1: Negative.

## 2022-08-19 DIAGNOSIS — L814 Other melanin hyperpigmentation: Secondary | ICD-10-CM | POA: Diagnosis not present

## 2022-08-19 DIAGNOSIS — L821 Other seborrheic keratosis: Secondary | ICD-10-CM | POA: Diagnosis not present

## 2022-08-19 DIAGNOSIS — C44619 Basal cell carcinoma of skin of left upper limb, including shoulder: Secondary | ICD-10-CM | POA: Diagnosis not present

## 2022-08-19 DIAGNOSIS — D485 Neoplasm of uncertain behavior of skin: Secondary | ICD-10-CM | POA: Diagnosis not present

## 2022-08-19 DIAGNOSIS — L570 Actinic keratosis: Secondary | ICD-10-CM | POA: Diagnosis not present

## 2022-08-19 DIAGNOSIS — Z09 Encounter for follow-up examination after completed treatment for conditions other than malignant neoplasm: Secondary | ICD-10-CM | POA: Diagnosis not present

## 2022-08-19 DIAGNOSIS — D225 Melanocytic nevi of trunk: Secondary | ICD-10-CM | POA: Diagnosis not present

## 2022-08-28 DIAGNOSIS — C44619 Basal cell carcinoma of skin of left upper limb, including shoulder: Secondary | ICD-10-CM | POA: Diagnosis not present

## 2022-12-11 DIAGNOSIS — L578 Other skin changes due to chronic exposure to nonionizing radiation: Secondary | ICD-10-CM | POA: Diagnosis not present

## 2022-12-11 DIAGNOSIS — L570 Actinic keratosis: Secondary | ICD-10-CM | POA: Diagnosis not present

## 2022-12-11 DIAGNOSIS — Z08 Encounter for follow-up examination after completed treatment for malignant neoplasm: Secondary | ICD-10-CM | POA: Diagnosis not present

## 2022-12-11 DIAGNOSIS — Z86007 Personal history of in-situ neoplasm of skin: Secondary | ICD-10-CM | POA: Diagnosis not present

## 2022-12-11 DIAGNOSIS — Z09 Encounter for follow-up examination after completed treatment for conditions other than malignant neoplasm: Secondary | ICD-10-CM | POA: Diagnosis not present

## 2023-06-11 DIAGNOSIS — M7542 Impingement syndrome of left shoulder: Secondary | ICD-10-CM | POA: Diagnosis not present

## 2023-06-11 DIAGNOSIS — M7541 Impingement syndrome of right shoulder: Secondary | ICD-10-CM | POA: Diagnosis not present

## 2023-06-24 DIAGNOSIS — M6281 Muscle weakness (generalized): Secondary | ICD-10-CM | POA: Diagnosis not present

## 2023-06-24 DIAGNOSIS — M25512 Pain in left shoulder: Secondary | ICD-10-CM | POA: Diagnosis not present

## 2023-06-24 DIAGNOSIS — M25611 Stiffness of right shoulder, not elsewhere classified: Secondary | ICD-10-CM | POA: Diagnosis not present

## 2023-06-24 DIAGNOSIS — M25511 Pain in right shoulder: Secondary | ICD-10-CM | POA: Diagnosis not present

## 2023-06-24 DIAGNOSIS — M25612 Stiffness of left shoulder, not elsewhere classified: Secondary | ICD-10-CM | POA: Diagnosis not present

## 2023-07-07 DIAGNOSIS — M25511 Pain in right shoulder: Secondary | ICD-10-CM | POA: Diagnosis not present

## 2023-07-07 DIAGNOSIS — M25612 Stiffness of left shoulder, not elsewhere classified: Secondary | ICD-10-CM | POA: Diagnosis not present

## 2023-07-07 DIAGNOSIS — M25512 Pain in left shoulder: Secondary | ICD-10-CM | POA: Diagnosis not present

## 2023-07-07 DIAGNOSIS — M25611 Stiffness of right shoulder, not elsewhere classified: Secondary | ICD-10-CM | POA: Diagnosis not present

## 2023-07-07 DIAGNOSIS — M6281 Muscle weakness (generalized): Secondary | ICD-10-CM | POA: Diagnosis not present

## 2023-07-23 DIAGNOSIS — M7542 Impingement syndrome of left shoulder: Secondary | ICD-10-CM | POA: Diagnosis not present

## 2023-07-23 DIAGNOSIS — M7541 Impingement syndrome of right shoulder: Secondary | ICD-10-CM | POA: Diagnosis not present

## 2023-07-23 DIAGNOSIS — M19011 Primary osteoarthritis, right shoulder: Secondary | ICD-10-CM | POA: Diagnosis not present

## 2023-08-05 DIAGNOSIS — M75121 Complete rotator cuff tear or rupture of right shoulder, not specified as traumatic: Secondary | ICD-10-CM | POA: Diagnosis not present

## 2023-08-05 DIAGNOSIS — M25511 Pain in right shoulder: Secondary | ICD-10-CM | POA: Diagnosis not present

## 2023-08-11 DIAGNOSIS — M12811 Other specific arthropathies, not elsewhere classified, right shoulder: Secondary | ICD-10-CM | POA: Diagnosis not present

## 2023-08-11 DIAGNOSIS — M7541 Impingement syndrome of right shoulder: Secondary | ICD-10-CM | POA: Diagnosis not present

## 2023-08-11 DIAGNOSIS — M19011 Primary osteoarthritis, right shoulder: Secondary | ICD-10-CM | POA: Diagnosis not present

## 2023-08-19 DIAGNOSIS — Z08 Encounter for follow-up examination after completed treatment for malignant neoplasm: Secondary | ICD-10-CM | POA: Diagnosis not present

## 2023-08-19 DIAGNOSIS — D225 Melanocytic nevi of trunk: Secondary | ICD-10-CM | POA: Diagnosis not present

## 2023-08-19 DIAGNOSIS — L821 Other seborrheic keratosis: Secondary | ICD-10-CM | POA: Diagnosis not present

## 2023-08-19 DIAGNOSIS — Z86007 Personal history of in-situ neoplasm of skin: Secondary | ICD-10-CM | POA: Diagnosis not present

## 2023-08-19 DIAGNOSIS — L814 Other melanin hyperpigmentation: Secondary | ICD-10-CM | POA: Diagnosis not present

## 2023-08-20 DIAGNOSIS — E559 Vitamin D deficiency, unspecified: Secondary | ICD-10-CM | POA: Diagnosis not present

## 2023-08-20 DIAGNOSIS — Z79899 Other long term (current) drug therapy: Secondary | ICD-10-CM | POA: Diagnosis not present

## 2023-08-20 DIAGNOSIS — R9431 Abnormal electrocardiogram [ECG] [EKG]: Secondary | ICD-10-CM | POA: Diagnosis not present

## 2023-08-20 DIAGNOSIS — M79609 Pain in unspecified limb: Secondary | ICD-10-CM | POA: Diagnosis not present

## 2023-08-20 DIAGNOSIS — I440 Atrioventricular block, first degree: Secondary | ICD-10-CM | POA: Diagnosis not present

## 2023-08-20 DIAGNOSIS — Z01818 Encounter for other preprocedural examination: Secondary | ICD-10-CM | POA: Diagnosis not present

## 2023-08-21 DIAGNOSIS — Z01818 Encounter for other preprocedural examination: Secondary | ICD-10-CM | POA: Diagnosis not present

## 2023-08-31 DIAGNOSIS — G8918 Other acute postprocedural pain: Secondary | ICD-10-CM | POA: Diagnosis not present

## 2023-08-31 DIAGNOSIS — M19011 Primary osteoarthritis, right shoulder: Secondary | ICD-10-CM | POA: Diagnosis not present

## 2023-09-01 DIAGNOSIS — Z9889 Other specified postprocedural states: Secondary | ICD-10-CM | POA: Diagnosis not present

## 2023-09-07 DIAGNOSIS — M25611 Stiffness of right shoulder, not elsewhere classified: Secondary | ICD-10-CM | POA: Diagnosis not present

## 2023-09-07 DIAGNOSIS — M25512 Pain in left shoulder: Secondary | ICD-10-CM | POA: Diagnosis not present

## 2023-09-07 DIAGNOSIS — M6281 Muscle weakness (generalized): Secondary | ICD-10-CM | POA: Diagnosis not present

## 2023-09-07 DIAGNOSIS — M25612 Stiffness of left shoulder, not elsewhere classified: Secondary | ICD-10-CM | POA: Diagnosis not present

## 2023-09-07 DIAGNOSIS — M25511 Pain in right shoulder: Secondary | ICD-10-CM | POA: Diagnosis not present

## 2023-09-15 DIAGNOSIS — M25611 Stiffness of right shoulder, not elsewhere classified: Secondary | ICD-10-CM | POA: Diagnosis not present

## 2023-09-15 DIAGNOSIS — M25612 Stiffness of left shoulder, not elsewhere classified: Secondary | ICD-10-CM | POA: Diagnosis not present

## 2023-09-15 DIAGNOSIS — M25512 Pain in left shoulder: Secondary | ICD-10-CM | POA: Diagnosis not present

## 2023-09-15 DIAGNOSIS — M25511 Pain in right shoulder: Secondary | ICD-10-CM | POA: Diagnosis not present

## 2023-09-15 DIAGNOSIS — M6281 Muscle weakness (generalized): Secondary | ICD-10-CM | POA: Diagnosis not present

## 2023-09-17 DIAGNOSIS — M79601 Pain in right arm: Secondary | ICD-10-CM | POA: Diagnosis not present

## 2023-09-17 DIAGNOSIS — M25511 Pain in right shoulder: Secondary | ICD-10-CM | POA: Diagnosis not present

## 2023-09-23 DIAGNOSIS — M79601 Pain in right arm: Secondary | ICD-10-CM | POA: Diagnosis not present

## 2023-09-23 DIAGNOSIS — M25511 Pain in right shoulder: Secondary | ICD-10-CM | POA: Diagnosis not present

## 2023-09-25 DIAGNOSIS — M79601 Pain in right arm: Secondary | ICD-10-CM | POA: Diagnosis not present

## 2023-09-25 DIAGNOSIS — M25511 Pain in right shoulder: Secondary | ICD-10-CM | POA: Diagnosis not present

## 2023-09-28 DIAGNOSIS — M79601 Pain in right arm: Secondary | ICD-10-CM | POA: Diagnosis not present

## 2023-09-28 DIAGNOSIS — M25511 Pain in right shoulder: Secondary | ICD-10-CM | POA: Diagnosis not present

## 2023-09-30 DIAGNOSIS — M25511 Pain in right shoulder: Secondary | ICD-10-CM | POA: Diagnosis not present

## 2023-09-30 DIAGNOSIS — M79601 Pain in right arm: Secondary | ICD-10-CM | POA: Diagnosis not present

## 2023-10-05 DIAGNOSIS — M79601 Pain in right arm: Secondary | ICD-10-CM | POA: Diagnosis not present

## 2023-10-05 DIAGNOSIS — M25511 Pain in right shoulder: Secondary | ICD-10-CM | POA: Diagnosis not present

## 2023-10-07 DIAGNOSIS — M25511 Pain in right shoulder: Secondary | ICD-10-CM | POA: Diagnosis not present

## 2023-10-07 DIAGNOSIS — M79601 Pain in right arm: Secondary | ICD-10-CM | POA: Diagnosis not present

## 2023-10-12 DIAGNOSIS — M25511 Pain in right shoulder: Secondary | ICD-10-CM | POA: Diagnosis not present

## 2023-10-12 DIAGNOSIS — M79601 Pain in right arm: Secondary | ICD-10-CM | POA: Diagnosis not present

## 2023-10-13 DIAGNOSIS — Z96611 Presence of right artificial shoulder joint: Secondary | ICD-10-CM | POA: Diagnosis not present

## 2023-10-13 DIAGNOSIS — M12811 Other specific arthropathies, not elsewhere classified, right shoulder: Secondary | ICD-10-CM | POA: Diagnosis not present

## 2023-10-14 ENCOUNTER — Other Ambulatory Visit: Payer: Self-pay | Admitting: Family Medicine

## 2023-10-14 DIAGNOSIS — Z1231 Encounter for screening mammogram for malignant neoplasm of breast: Secondary | ICD-10-CM

## 2023-10-15 DIAGNOSIS — M25511 Pain in right shoulder: Secondary | ICD-10-CM | POA: Diagnosis not present

## 2023-10-15 DIAGNOSIS — M79601 Pain in right arm: Secondary | ICD-10-CM | POA: Diagnosis not present

## 2023-10-19 DIAGNOSIS — M79601 Pain in right arm: Secondary | ICD-10-CM | POA: Diagnosis not present

## 2023-10-19 DIAGNOSIS — M25511 Pain in right shoulder: Secondary | ICD-10-CM | POA: Diagnosis not present

## 2023-10-21 DIAGNOSIS — M25511 Pain in right shoulder: Secondary | ICD-10-CM | POA: Diagnosis not present

## 2023-10-21 DIAGNOSIS — M79601 Pain in right arm: Secondary | ICD-10-CM | POA: Diagnosis not present

## 2023-10-27 DIAGNOSIS — M79601 Pain in right arm: Secondary | ICD-10-CM | POA: Diagnosis not present

## 2023-10-27 DIAGNOSIS — M25511 Pain in right shoulder: Secondary | ICD-10-CM | POA: Diagnosis not present

## 2023-11-02 DIAGNOSIS — M25511 Pain in right shoulder: Secondary | ICD-10-CM | POA: Diagnosis not present

## 2023-11-02 DIAGNOSIS — I459 Conduction disorder, unspecified: Secondary | ICD-10-CM | POA: Diagnosis not present

## 2023-11-02 DIAGNOSIS — R002 Palpitations: Secondary | ICD-10-CM | POA: Diagnosis not present

## 2023-11-02 DIAGNOSIS — I10 Essential (primary) hypertension: Secondary | ICD-10-CM | POA: Diagnosis not present

## 2023-11-02 DIAGNOSIS — M79601 Pain in right arm: Secondary | ICD-10-CM | POA: Diagnosis not present

## 2023-11-02 DIAGNOSIS — E785 Hyperlipidemia, unspecified: Secondary | ICD-10-CM | POA: Diagnosis not present

## 2023-11-02 DIAGNOSIS — I440 Atrioventricular block, first degree: Secondary | ICD-10-CM | POA: Diagnosis not present

## 2023-11-04 DIAGNOSIS — M79601 Pain in right arm: Secondary | ICD-10-CM | POA: Diagnosis not present

## 2023-11-04 DIAGNOSIS — M25511 Pain in right shoulder: Secondary | ICD-10-CM | POA: Diagnosis not present

## 2023-11-09 ENCOUNTER — Ambulatory Visit

## 2023-11-10 DIAGNOSIS — M25511 Pain in right shoulder: Secondary | ICD-10-CM | POA: Diagnosis not present

## 2023-11-10 DIAGNOSIS — M79601 Pain in right arm: Secondary | ICD-10-CM | POA: Diagnosis not present

## 2023-11-12 DIAGNOSIS — M79601 Pain in right arm: Secondary | ICD-10-CM | POA: Diagnosis not present

## 2023-11-12 DIAGNOSIS — M25511 Pain in right shoulder: Secondary | ICD-10-CM | POA: Diagnosis not present

## 2023-11-16 DIAGNOSIS — M25511 Pain in right shoulder: Secondary | ICD-10-CM | POA: Diagnosis not present

## 2023-11-16 DIAGNOSIS — M79601 Pain in right arm: Secondary | ICD-10-CM | POA: Diagnosis not present

## 2023-11-17 ENCOUNTER — Ambulatory Visit
Admission: RE | Admit: 2023-11-17 | Discharge: 2023-11-17 | Disposition: A | Discharge status: Home / Self Care | Source: Ambulatory Visit | Attending: Family Medicine | Admitting: Family Medicine

## 2023-11-17 DIAGNOSIS — Z1231 Encounter for screening mammogram for malignant neoplasm of breast: Secondary | ICD-10-CM | POA: Diagnosis not present

## 2023-11-19 DIAGNOSIS — M79601 Pain in right arm: Secondary | ICD-10-CM | POA: Diagnosis not present

## 2023-11-19 DIAGNOSIS — M25511 Pain in right shoulder: Secondary | ICD-10-CM | POA: Diagnosis not present

## 2023-11-23 DIAGNOSIS — M79601 Pain in right arm: Secondary | ICD-10-CM | POA: Diagnosis not present

## 2023-11-23 DIAGNOSIS — M25511 Pain in right shoulder: Secondary | ICD-10-CM | POA: Diagnosis not present

## 2023-11-24 DIAGNOSIS — M12811 Other specific arthropathies, not elsewhere classified, right shoulder: Secondary | ICD-10-CM | POA: Diagnosis not present

## 2023-11-24 DIAGNOSIS — Z96611 Presence of right artificial shoulder joint: Secondary | ICD-10-CM | POA: Diagnosis not present

## 2023-11-26 DIAGNOSIS — M79601 Pain in right arm: Secondary | ICD-10-CM | POA: Diagnosis not present

## 2023-11-26 DIAGNOSIS — M25511 Pain in right shoulder: Secondary | ICD-10-CM | POA: Diagnosis not present

## 2023-11-30 DIAGNOSIS — M79601 Pain in right arm: Secondary | ICD-10-CM | POA: Diagnosis not present

## 2023-11-30 DIAGNOSIS — M25511 Pain in right shoulder: Secondary | ICD-10-CM | POA: Diagnosis not present

## 2023-12-02 DIAGNOSIS — M79601 Pain in right arm: Secondary | ICD-10-CM | POA: Diagnosis not present

## 2023-12-02 DIAGNOSIS — M25511 Pain in right shoulder: Secondary | ICD-10-CM | POA: Diagnosis not present

## 2023-12-07 DIAGNOSIS — M25511 Pain in right shoulder: Secondary | ICD-10-CM | POA: Diagnosis not present

## 2023-12-07 DIAGNOSIS — M79601 Pain in right arm: Secondary | ICD-10-CM | POA: Diagnosis not present

## 2023-12-11 DIAGNOSIS — M79601 Pain in right arm: Secondary | ICD-10-CM | POA: Diagnosis not present

## 2023-12-11 DIAGNOSIS — M25511 Pain in right shoulder: Secondary | ICD-10-CM | POA: Diagnosis not present

## 2023-12-14 DIAGNOSIS — M79601 Pain in right arm: Secondary | ICD-10-CM | POA: Diagnosis not present

## 2023-12-14 DIAGNOSIS — M25511 Pain in right shoulder: Secondary | ICD-10-CM | POA: Diagnosis not present

## 2024-01-06 DIAGNOSIS — R300 Dysuria: Secondary | ICD-10-CM | POA: Diagnosis not present

## 2024-02-26 DIAGNOSIS — Z96611 Presence of right artificial shoulder joint: Secondary | ICD-10-CM | POA: Diagnosis not present

## 2024-03-01 DIAGNOSIS — D225 Melanocytic nevi of trunk: Secondary | ICD-10-CM | POA: Diagnosis not present

## 2024-03-01 DIAGNOSIS — L814 Other melanin hyperpigmentation: Secondary | ICD-10-CM | POA: Diagnosis not present

## 2024-03-01 DIAGNOSIS — L821 Other seborrheic keratosis: Secondary | ICD-10-CM | POA: Diagnosis not present

## 2024-03-01 DIAGNOSIS — L570 Actinic keratosis: Secondary | ICD-10-CM | POA: Diagnosis not present
